# Patient Record
Sex: Male | Born: 1940 | Race: White | Hispanic: No | Marital: Married | Smoking: Former smoker
Health system: Southern US, Community
[De-identification: ages and names within clinical notes are randomized; demographics above are authoritative.]

## PROBLEM LIST (undated history)

## (undated) DIAGNOSIS — G4733 Obstructive sleep apnea (adult) (pediatric): Secondary | ICD-10-CM

## (undated) DIAGNOSIS — J449 Chronic obstructive pulmonary disease, unspecified: Secondary | ICD-10-CM

## (undated) DIAGNOSIS — K219 Gastro-esophageal reflux disease without esophagitis: Secondary | ICD-10-CM

## (undated) DIAGNOSIS — E782 Mixed hyperlipidemia: Secondary | ICD-10-CM

## (undated) DIAGNOSIS — H3530 Unspecified macular degeneration: Secondary | ICD-10-CM

## (undated) DIAGNOSIS — R911 Solitary pulmonary nodule: Secondary | ICD-10-CM

## (undated) HISTORY — DX: Gastro-esophageal reflux disease without esophagitis: K21.9

## (undated) HISTORY — DX: Mixed hyperlipidemia: E78.2

## (undated) HISTORY — DX: Obstructive sleep apnea (adult) (pediatric): G47.33

## (undated) HISTORY — PX: CATARACT EXTRACTION: SUR2

## (undated) HISTORY — PX: RETINAL DETACHMENT REPAIR W/ SCLERAL BUCKLE LE: SHX2338

## (undated) HISTORY — DX: Chronic obstructive pulmonary disease, unspecified: J44.9

## (undated) HISTORY — PX: OTHER SURGICAL HISTORY: SHX169

## (undated) HISTORY — DX: Solitary pulmonary nodule: R91.1

## (undated) HISTORY — DX: Unspecified macular degeneration: H35.30

---

## 1998-11-15 ENCOUNTER — Ambulatory Visit (HOSPITAL_COMMUNITY): Admission: RE | Admit: 1998-11-15 | Discharge: 1998-11-15 | Payer: Self-pay | Admitting: Internal Medicine

## 1998-12-20 ENCOUNTER — Encounter: Payer: Self-pay | Admitting: Thoracic Surgery

## 1998-12-21 ENCOUNTER — Inpatient Hospital Stay (HOSPITAL_COMMUNITY): Admission: RE | Admit: 1998-12-21 | Discharge: 1998-12-25 | Payer: Self-pay | Admitting: Thoracic Surgery

## 1998-12-22 ENCOUNTER — Encounter: Payer: Self-pay | Admitting: Thoracic Surgery

## 1998-12-23 ENCOUNTER — Encounter: Payer: Self-pay | Admitting: Thoracic Surgery

## 1998-12-24 ENCOUNTER — Encounter: Payer: Self-pay | Admitting: Thoracic Surgery

## 1999-05-30 ENCOUNTER — Encounter: Admission: RE | Admit: 1999-05-30 | Discharge: 1999-05-30 | Payer: Self-pay | Admitting: Thoracic Surgery

## 1999-05-30 ENCOUNTER — Encounter: Payer: Self-pay | Admitting: Thoracic Surgery

## 2000-01-01 ENCOUNTER — Encounter: Admission: RE | Admit: 2000-01-01 | Discharge: 2000-01-01 | Payer: Self-pay | Admitting: Thoracic Surgery

## 2000-01-01 ENCOUNTER — Encounter: Payer: Self-pay | Admitting: Thoracic Surgery

## 2000-03-21 ENCOUNTER — Inpatient Hospital Stay (HOSPITAL_COMMUNITY): Admission: EM | Admit: 2000-03-21 | Discharge: 2000-03-22 | Payer: Self-pay | Admitting: *Deleted

## 2000-04-30 ENCOUNTER — Encounter: Admission: RE | Admit: 2000-04-30 | Discharge: 2000-04-30 | Payer: Self-pay | Admitting: Thoracic Surgery

## 2000-04-30 ENCOUNTER — Encounter: Payer: Self-pay | Admitting: Thoracic Surgery

## 2000-10-29 ENCOUNTER — Encounter: Admission: RE | Admit: 2000-10-29 | Discharge: 2000-10-29 | Payer: Self-pay | Admitting: Thoracic Surgery

## 2000-10-29 ENCOUNTER — Encounter: Payer: Self-pay | Admitting: Thoracic Surgery

## 2000-11-18 ENCOUNTER — Other Ambulatory Visit: Admission: RE | Admit: 2000-11-18 | Discharge: 2000-11-18 | Payer: Self-pay | Admitting: Internal Medicine

## 2000-12-08 ENCOUNTER — Ambulatory Visit (HOSPITAL_COMMUNITY): Admission: RE | Admit: 2000-12-08 | Discharge: 2000-12-08 | Payer: Self-pay | Admitting: Internal Medicine

## 2002-01-04 ENCOUNTER — Ambulatory Visit (HOSPITAL_COMMUNITY): Admission: RE | Admit: 2002-01-04 | Discharge: 2002-01-04 | Payer: Self-pay | Admitting: Internal Medicine

## 2002-06-14 ENCOUNTER — Ambulatory Visit (HOSPITAL_COMMUNITY): Admission: RE | Admit: 2002-06-14 | Discharge: 2002-06-14 | Payer: Self-pay | Admitting: Family Medicine

## 2002-06-14 ENCOUNTER — Encounter: Payer: Self-pay | Admitting: Family Medicine

## 2002-08-09 ENCOUNTER — Ambulatory Visit: Admission: RE | Admit: 2002-08-09 | Discharge: 2002-08-09 | Payer: Self-pay | Admitting: Internal Medicine

## 2003-03-23 ENCOUNTER — Encounter (INDEPENDENT_AMBULATORY_CARE_PROVIDER_SITE_OTHER): Payer: Self-pay | Admitting: Internal Medicine

## 2003-03-23 ENCOUNTER — Ambulatory Visit (HOSPITAL_COMMUNITY): Admission: RE | Admit: 2003-03-23 | Discharge: 2003-03-23 | Payer: Self-pay | Admitting: Internal Medicine

## 2003-03-29 ENCOUNTER — Ambulatory Visit (HOSPITAL_COMMUNITY): Admission: RE | Admit: 2003-03-29 | Discharge: 2003-03-29 | Payer: Self-pay | Admitting: Internal Medicine

## 2003-03-29 ENCOUNTER — Encounter (INDEPENDENT_AMBULATORY_CARE_PROVIDER_SITE_OTHER): Payer: Self-pay | Admitting: Internal Medicine

## 2003-04-07 ENCOUNTER — Ambulatory Visit (HOSPITAL_COMMUNITY): Admission: RE | Admit: 2003-04-07 | Discharge: 2003-04-07 | Payer: Self-pay | Admitting: Internal Medicine

## 2003-05-04 ENCOUNTER — Encounter: Payer: Self-pay | Admitting: Internal Medicine

## 2003-05-04 ENCOUNTER — Encounter: Admission: RE | Admit: 2003-05-04 | Discharge: 2003-05-04 | Payer: Self-pay | Admitting: Internal Medicine

## 2003-05-18 ENCOUNTER — Ambulatory Visit (HOSPITAL_COMMUNITY): Admission: RE | Admit: 2003-05-18 | Discharge: 2003-05-18 | Payer: Self-pay | Admitting: Family Medicine

## 2003-05-18 ENCOUNTER — Encounter: Payer: Self-pay | Admitting: Family Medicine

## 2003-11-25 ENCOUNTER — Ambulatory Visit (HOSPITAL_COMMUNITY): Admission: RE | Admit: 2003-11-25 | Discharge: 2003-11-25 | Payer: Self-pay | Admitting: Internal Medicine

## 2004-01-22 ENCOUNTER — Inpatient Hospital Stay (HOSPITAL_COMMUNITY): Admission: EM | Admit: 2004-01-22 | Discharge: 2004-01-23 | Payer: Self-pay | Admitting: Emergency Medicine

## 2004-01-22 ENCOUNTER — Encounter (INDEPENDENT_AMBULATORY_CARE_PROVIDER_SITE_OTHER): Payer: Self-pay | Admitting: *Deleted

## 2004-01-22 ENCOUNTER — Encounter: Payer: Self-pay | Admitting: Emergency Medicine

## 2004-12-19 ENCOUNTER — Ambulatory Visit (HOSPITAL_COMMUNITY): Admission: RE | Admit: 2004-12-19 | Discharge: 2004-12-19 | Payer: Self-pay | Admitting: Internal Medicine

## 2004-12-28 ENCOUNTER — Ambulatory Visit: Payer: Self-pay | Admitting: Internal Medicine

## 2005-01-21 ENCOUNTER — Ambulatory Visit: Payer: Self-pay | Admitting: Internal Medicine

## 2005-01-22 ENCOUNTER — Ambulatory Visit: Payer: Self-pay | Admitting: Internal Medicine

## 2005-02-07 ENCOUNTER — Ambulatory Visit (HOSPITAL_COMMUNITY): Admission: RE | Admit: 2005-02-07 | Discharge: 2005-02-07 | Payer: Self-pay | Admitting: Internal Medicine

## 2005-02-08 ENCOUNTER — Ambulatory Visit: Payer: Self-pay | Admitting: Internal Medicine

## 2005-02-09 ENCOUNTER — Inpatient Hospital Stay (HOSPITAL_COMMUNITY): Admission: EM | Admit: 2005-02-09 | Discharge: 2005-02-13 | Payer: Self-pay | Admitting: Emergency Medicine

## 2005-02-17 ENCOUNTER — Emergency Department (HOSPITAL_COMMUNITY): Admission: EM | Admit: 2005-02-17 | Discharge: 2005-02-17 | Payer: Self-pay | Admitting: Emergency Medicine

## 2005-02-21 ENCOUNTER — Ambulatory Visit: Payer: Self-pay | Admitting: Internal Medicine

## 2005-03-20 ENCOUNTER — Ambulatory Visit: Payer: Self-pay | Admitting: Internal Medicine

## 2005-04-30 ENCOUNTER — Ambulatory Visit: Payer: Self-pay | Admitting: Internal Medicine

## 2005-06-19 ENCOUNTER — Ambulatory Visit: Payer: Self-pay | Admitting: Internal Medicine

## 2005-07-18 ENCOUNTER — Ambulatory Visit: Payer: Self-pay | Admitting: Internal Medicine

## 2005-08-05 HISTORY — PX: LYMPH NODE DISSECTION: SHX5087

## 2005-08-13 ENCOUNTER — Ambulatory Visit (HOSPITAL_COMMUNITY): Admission: RE | Admit: 2005-08-13 | Discharge: 2005-08-13 | Payer: Self-pay | Admitting: Urology

## 2005-11-13 ENCOUNTER — Ambulatory Visit: Payer: Self-pay | Admitting: Pain Medicine

## 2005-11-21 ENCOUNTER — Ambulatory Visit: Payer: Self-pay | Admitting: Pain Medicine

## 2005-11-26 ENCOUNTER — Ambulatory Visit: Payer: Self-pay | Admitting: Pain Medicine

## 2005-12-09 ENCOUNTER — Ambulatory Visit: Payer: Self-pay | Admitting: Pain Medicine

## 2005-12-12 ENCOUNTER — Ambulatory Visit: Payer: Self-pay | Admitting: Pain Medicine

## 2005-12-23 ENCOUNTER — Ambulatory Visit: Payer: Self-pay | Admitting: Internal Medicine

## 2005-12-26 ENCOUNTER — Ambulatory Visit: Payer: Self-pay | Admitting: Pain Medicine

## 2006-01-08 ENCOUNTER — Ambulatory Visit: Payer: Self-pay | Admitting: Physician Assistant

## 2006-04-08 ENCOUNTER — Encounter (HOSPITAL_COMMUNITY): Admission: RE | Admit: 2006-04-08 | Discharge: 2006-05-02 | Payer: Self-pay | Admitting: Unknown Physician Specialty

## 2006-04-25 ENCOUNTER — Emergency Department (HOSPITAL_COMMUNITY): Admission: EM | Admit: 2006-04-25 | Discharge: 2006-04-25 | Payer: Self-pay | Admitting: Emergency Medicine

## 2006-05-06 ENCOUNTER — Ambulatory Visit: Payer: Self-pay | Admitting: Internal Medicine

## 2006-07-04 ENCOUNTER — Ambulatory Visit: Payer: Self-pay | Admitting: Internal Medicine

## 2006-07-18 ENCOUNTER — Ambulatory Visit: Payer: Self-pay | Admitting: Internal Medicine

## 2006-07-26 ENCOUNTER — Encounter: Admission: RE | Admit: 2006-07-26 | Discharge: 2006-07-26 | Payer: Self-pay | Admitting: Neurosurgery

## 2007-02-11 ENCOUNTER — Observation Stay (HOSPITAL_COMMUNITY): Admission: RE | Admit: 2007-02-11 | Discharge: 2007-02-11 | Payer: Self-pay | Admitting: Ophthalmology

## 2007-05-15 DIAGNOSIS — E7800 Pure hypercholesterolemia, unspecified: Secondary | ICD-10-CM

## 2007-05-15 DIAGNOSIS — R911 Solitary pulmonary nodule: Secondary | ICD-10-CM

## 2007-05-15 DIAGNOSIS — Z87891 Personal history of nicotine dependence: Secondary | ICD-10-CM

## 2007-05-15 DIAGNOSIS — J449 Chronic obstructive pulmonary disease, unspecified: Secondary | ICD-10-CM

## 2007-07-17 ENCOUNTER — Ambulatory Visit: Payer: Self-pay | Admitting: Internal Medicine

## 2007-07-24 ENCOUNTER — Encounter: Payer: Self-pay | Admitting: Internal Medicine

## 2007-08-01 ENCOUNTER — Telehealth: Payer: Self-pay | Admitting: Internal Medicine

## 2008-05-30 ENCOUNTER — Ambulatory Visit: Payer: Self-pay | Admitting: Internal Medicine

## 2008-10-18 ENCOUNTER — Encounter: Payer: Self-pay | Admitting: Internal Medicine

## 2008-11-03 ENCOUNTER — Ambulatory Visit: Payer: Self-pay | Admitting: Internal Medicine

## 2009-11-03 ENCOUNTER — Ambulatory Visit: Payer: Self-pay | Admitting: Internal Medicine

## 2009-11-13 ENCOUNTER — Ambulatory Visit: Admission: RE | Admit: 2009-11-13 | Discharge: 2009-11-13 | Payer: Self-pay | Admitting: Internal Medicine

## 2009-11-13 ENCOUNTER — Encounter: Payer: Self-pay | Admitting: Internal Medicine

## 2009-11-18 ENCOUNTER — Ambulatory Visit: Payer: Self-pay | Admitting: Internal Medicine

## 2009-12-01 ENCOUNTER — Telehealth: Payer: Self-pay | Admitting: Internal Medicine

## 2009-12-02 DIAGNOSIS — G4733 Obstructive sleep apnea (adult) (pediatric): Secondary | ICD-10-CM | POA: Insufficient documentation

## 2009-12-04 ENCOUNTER — Telehealth (INDEPENDENT_AMBULATORY_CARE_PROVIDER_SITE_OTHER): Payer: Self-pay | Admitting: *Deleted

## 2009-12-20 ENCOUNTER — Telehealth: Payer: Self-pay | Admitting: Internal Medicine

## 2009-12-26 ENCOUNTER — Encounter: Payer: Self-pay | Admitting: Internal Medicine

## 2009-12-27 ENCOUNTER — Telehealth: Payer: Self-pay | Admitting: Internal Medicine

## 2010-01-03 ENCOUNTER — Encounter: Payer: Self-pay | Admitting: Internal Medicine

## 2010-01-06 ENCOUNTER — Telehealth: Payer: Self-pay | Admitting: Internal Medicine

## 2010-01-18 ENCOUNTER — Encounter: Payer: Self-pay | Admitting: Internal Medicine

## 2010-02-23 ENCOUNTER — Ambulatory Visit: Payer: Self-pay | Admitting: Internal Medicine

## 2010-03-24 ENCOUNTER — Encounter: Payer: Self-pay | Admitting: Internal Medicine

## 2010-07-05 ENCOUNTER — Encounter: Payer: Self-pay | Admitting: Internal Medicine

## 2010-09-04 NOTE — Progress Notes (Signed)
Summary: c pap  Phone Note Call from Patient Call back at [phone]   Caller: [facility] Call For: young Summary of Call: need c pap evaluation Initial call taken by: Rickard Patience,  Dec 04, 2009 10:34 AM  Follow-up for Phone Call        [facility] is requesting ov notes sent in regards to order faxed on 11/03/09 as to why the pt was set up for sleep study. Fax # [phone] Carron Curie CMA  Dec 04, 2009 10:43 AM   Additional Follow-up for Phone Call Additional follow up Details #1::        faxedt ov notes to [facility] Additional Follow-up by: Oneita Jolly,  Dec 04, 2009 10:45 AM

## 2010-09-04 NOTE — Progress Notes (Signed)
Summary: Start CPAP  Phone Note Call from Patient Call back at Home Phone [phone]   Caller: Patient Call For: Monteen Toops Summary of Call: pt states he has not heard back re: cxr from 4/1. also wants to know about sleep study results from 4/11.  Initial call taken by: Tivis Ringer, CNA,  December 01, 2009 10:37 AM  Follow-up for Phone Call        Pt requesting results of CXR from 11/03/09 and sleep study from 11/13/09. Please advise.Carron Curie CMA  December 01, 2009 10:39 AM   Additional Follow-up for Phone Call Additional follow up Details #1::        I called Mr Squier and reviewd CXR and NPSG. I explained dx sleep apnea . He is ready to try CPAP and asks that it be set up through [employer]. He also understands he will need office face to face f/u within the Medicare window. Additional Follow-up by: Waymon Budge MD,  December 01, 2009 5:33 PM  New Problems: OBSTRUCTIVE SLEEP APNEA (ICD-327.23)   New Problems: OBSTRUCTIVE SLEEP APNEA (ICD-327.23) New/Updated Medications: * CPAP 7 [facility]

## 2010-09-04 NOTE — Progress Notes (Signed)
Summary: order  Phone Note Call from Patient   Caller: Patient Call For: Captain Blucher Summary of Call: pt need order to get auto cpap titration . send to [facility] Initial call taken by: Rickard Patience,  Dec 20, 2009 11:39 AM  Follow-up for Phone Call        pt states he feels liks he cannot breathe on the settigns he is on, he staets he has to sit up to feel like he is getting any air. He spoke to [employer] and they are rec. an order for auto titration to see pt optimal settings. Pelase advise if ok to place order. Thanks.Carron Curie CMA  Dec 20, 2009 11:50 AM   Additional Follow-up for Phone Call Additional follow up Details #1::        OK- ordered. If [place] keeps up, it may be a different health problem rather than his sleep apnea.  Order faxed to [facility]. Rhonda Cobb  Dec 20, 2009 3:50 PM Spoke with pt and advised pt that once we do the download that if he is still having problems breathing then he will need to make an appt with Dr. Maple Hudson b/c it could be another health issue that is causing his breathing issues. Pt understood and stated that he would let us know if his breathing issue doesn't clear. Rhonda Cobb  Dec 20, 2009 3:55 PM

## 2010-09-04 NOTE — Letter (Signed)
Summary: CMN/[facility]  CMN/[facility]   Imported By: Lester [place] 03/28/2010 08:00:32  _____________________________________________________________________  External Attachment:    Type:   Image     Comment:   External Document

## 2010-09-04 NOTE — Progress Notes (Signed)
Summary: Asks change CPAP back to AutoPAP 20/8  Phone Note Call from Patient   Summary of Call: While here 01/04/10 with mother's visit, he asked if we could change CPAP back to permanent autotitration. He is pulling mask off in sleep repeatedly since fixed pressure used.  Follow-up for Phone Call        Order faxed to [facility] to change cpap back to auto at 20/8. Rhonda Cobb  January 08, 2010 8:40 AM     New/Updated Medications: * CPAP AUTO 20-8  [facility] Intolerant fixed pressure

## 2010-09-04 NOTE — Assessment & Plan Note (Signed)
Summary: 1 year/ mbw   Primary Provider/Referring Provider:  Caprice Renshaw  CC:  Yearly follow up visit-? about sleep apnea..  History of Present Illness:  05/30/08- Here with wife. CXR 12/08 questioned new nodule, revealed on CT at [facility] to be stable old fibrotic scarring with no new process. Since quit smoking breathing has been comfortable. Now pending cataract sgy. Denies change, new problems with breathing. Little cough, no chest pain, palpitation, bleeding or fever.  PFT 04/30/05- minimal obstrudtive disease. FEV1/FVC 0.74. DLCO 75%.  has been told he has cerebrovascular disease- left carotid. Declines Flu vax.  11/03/08- Hx Lung nodule had acute bronchits, probably from viral infection late Feb. Needed 2 rounds prednisone, antibiotics. He is almost at 1 yr anniversary of smoking cessation. Has gained weight, making him more sob. Walks 1/2 hr daily. Denies routine cough or chest pain. Had borderline sleep apnea. with RDI 5/hr in jan '04. Wife punches hiim and we discussed this relative to his weight gain.  Brings CXR done 10/18/08- Chronic changes and old surgery, but NAD. Images reviewed.   November 03, 2009- Hx lung nodule, former tobacco, ? OSA He doesn't notice change. He asks if he should have a new sleep study. Wife and Minister on a trip with him both say he snores and stops breathing. Denies significant daytime sleepiness. Some weight gain. He tries to walk and exercise daily to take care of chronic back pain. NPSG 2004, AHI 5 when he weighed 171lbs. Now 198 lbs.    Current Medications (verified): 1)  Nexium 40 Mg  Cpdr (Esomeprazole Magnesium) .... Take 1 Tablet By Mouth Two Times A Day 2)  Paxil 20 Mg  Tabs (Paroxetine Hcl) .... Take 1 Tablet By Mouth Once A Day 3)  Vytorin 10-80 Mg Tabs (Ezetimibe-Simvastatin) .... Take 1 By Mouth Once Daily 4)  Amitriptyline Hcl 75 Mg  Tabs (Amitriptyline Hcl) .... Take 1 Tab By Mouth At Bedtime 5)  Temazepam 30 Mg  Caps (Temazepam) .... Take  1 Tab By Mouth At Bedtime 6)  Bayer Low Strength 81 Mg  Tbec (Aspirin) .... Take 1 Tablet By Mouth Once A Day 7)  Multivitamins   Tabs (Multiple Vitamin) .... Take 1 Tablet By Mouth Once A Day 8)  Gabapentin 300 Mg Caps (Gabapentin) .... Take 1 By Mouth Two Times A Day 9)  Meloxicam 7.5 Mg Tabs (Meloxicam) .... Take 1 By Mouth Two Times A Day  Allergies (verified): 1)  ! Codeine 2)  ! Morphine 3)  ! Erythromycin 4)  ! * Dilaudid  Past History:  Past Surgical History: Last updated: 05/30/2008 cataract VATS resection intraparenchymal lymph node  Family History: Last updated: 11/03/2009 Mother- vocal cord paralysis, sleeps with BIPAP Father- died pneumonia, CHF  Social History: Last updated: 11/03/2009 Patient states former smoker. Quit 2009 married  Risk Factors: Smoking Status: quit (05/30/2008)  Past Medical History: R VATS for intrapulmonary lymph node 2007 NPSG wnl- 08/09/02, AHI 5; Repair deviated nasal septum Left carotid disease COPD- mild- FEV1/FVC 0.74- 2006  Family History: Mother- vocal cord paralysis, sleeps with BIPAP Father- died pneumonia, CHF  Social History: Patient states former smoker. Quit 2009 married  Review of Systems      See HPI  The patient denies anorexia, fever, weight loss, weight gain, vision loss, decreased hearing, hoarseness, chest pain, syncope, dyspnea on exertion, peripheral edema, prolonged cough, headaches, hemoptysis, and severe indigestion/heartburn.    Vital Signs:  Patient profile:   70 year old male Height:  70 inches Weight:      198.25 pounds BMI:     28.55 O2 Sat:      95 % on Room air Pulse rate:   86 / minute BP sitting:   120 / 78  (left arm) Cuff size:   regular  Vitals Entered By: Reynaldo Minium CMA (November 03, 2009 1:38 PM)  O2 Flow:  Room air  Physical Exam  Additional Exam:  General: A/Ox3; pleasant and cooperative, NAD, weight gain SKIN: no rash, lesions NODES: no lymphadenopathy HEENT: [place]/AT,  EOM- WNL, Conjuctivae- clear, PERRLA, TM-hearing aides, Nose- clear, Throat- clear and wnl, dentures, Mallampati  IV NECK: Supple w/ fair ROM, JVD- none, normal carotid impulses w/o bruits Thyroid- normal to palpation CHEST: Clear to P&A, question trace expiratory wheeze in bases. HEART: RRR, no m/g/r heard ABDOMEN: Soft  KYH:CWCB, nl pulses, no edema  NEURO: Grossly intact to observation      Impression & Recommendations:  Problem # 1:  COPD, MILD (ICD-496) Controlled without clinical change.  Problem # 2:  SNORING (ICD-786.09)  He has gained over 20 lbs since last sleep study and may well have more significant sleep apnea. We can arrange a sleep study.  Medications Added to Medication List This Visit: 1)  Vytorin 10-80 Mg Tabs (Ezetimibe-simvastatin) .... Take 1 by mouth once daily 2)  Gabapentin 300 Mg Caps (Gabapentin) .... Take 1 by mouth two times a day 3)  Meloxicam 7.5 Mg Tabs (Meloxicam) .... Take 1 by mouth two times a day  Other Orders: Est. Patient Level III (76283) Sleep Disorder Referral (Sleep Disorder) Radiology Referral (Radiology)  Patient Instructions: 1)  Please schedule a follow-up appointment in 1 year. 2)  See [facility] to schedule CXR and Sleep Study at [facility]

## 2010-09-04 NOTE — Letter (Signed)
Summary: CMN/[facility]  CMN/[facility]   Imported By: Lester [place] 01/23/2010 08:58:52  _____________________________________________________________________  External Attachment:    Type:   Image     Comment:   External Document

## 2010-09-04 NOTE — Letter (Signed)
Summary: CMN/[facility]  CMN/[facility]   Imported By: Lester [place] 07/13/2010 09:45:02  _____________________________________________________________________  External Attachment:    Type:   Image     Comment:   External Document

## 2010-09-04 NOTE — Progress Notes (Signed)
Summary: Change cpap to 14 based on download  Phone Note Other Incoming   Summary of Call: [facility] sends CPAP download from 12/26/09- 14 cwp. will change cpap order to that. Initial call taken by: Waymon Budge MD,  Dec 27, 2009 5:47 PM    New/Updated Medications: * CPAP 14  [facility]

## 2010-09-04 NOTE — Assessment & Plan Note (Signed)
Summary: cpap re-eval/klw   Primary Provider/Referring Provider:  Caprice Renshaw  CC:  cpap re evaluation, pt states he uses cpapc eveyrnight for 7-8 hrs a night and uses it when take naps, pt states occasionally the mask will slip up over his face during the night, and pt has no current problems going on.  History of Present Illness: 11/03/08- Hx Lung nodule had acute bronchits, probably from viral infection late Feb. Needed 2 rounds prednisone, antibiotics. He is almost at 1 yr anniversary of smoking cessation. Has gained weight, making him more sob. Walks 1/2 hr daily. Denies routine cough or chest pain. Had borderline sleep apnea. with RDI 5/hr in jan '04. Wife punches hiim and we discussed this relative to his weight gain.  Brings CXR done 10/18/08- Chronic changes and old surgery, but NAD. Images reviewed.   November 03, 2009- Hx lung nodule, former tobacco, ? OSA He doesn't notice change. He asks if he should have a new sleep study. Wife and Minister on a trip with him both say he snores and stops breathing. Denies significant daytime sleepiness. Some weight gain. He tries to walk and exercise daily to take care of chronic back pain. NPSG 2004, AHI 5 when he weighed 171lbs. Now 198 lbs.  February 23, 2010- OSA, hx lung nodule.................Marland Kitchenwife here He had asked change from CPAP 14, based on  download, back to AutoPAP range 8-20 cwp. Was not comfortable on fixed CPAP. He says AutoPAP is much better and wife says he doesn/'t snore and isn't as restless. Wears it at least 5-6 hours/ night.  Weight up since he's been sitting with mother during her illness and unable to walk regularly.    Preventive Screening-Counseling & Management  Alcohol-Tobacco     Smoking Status: quit     Packs/Day: >1ppd     Year Started: age 37     Year Quit: 2009  Current Medications (verified): 1)  Nexium 40 Mg  Cpdr (Esomeprazole Magnesium) .... Take 1 Tablet By Mouth Two Times A Day 2)  Paxil 20 Mg  Tabs  (Paroxetine Hcl) .... Take 1 Tablet By Mouth Once A Day 3)  Vytorin 10-80 Mg Tabs (Ezetimibe-Simvastatin) .... Take 1 By Mouth Once Daily 4)  Amitriptyline Hcl 75 Mg  Tabs (Amitriptyline Hcl) .... Take 1 Tab By Mouth At Bedtime 5)  Temazepam 30 Mg  Caps (Temazepam) .... Take 1 Tab By Mouth At Bedtime 6)  Bayer Low Strength 81 Mg  Tbec (Aspirin) .... Take 1 Tablet By Mouth Once A Day 7)  Multivitamins   Tabs (Multiple Vitamin) .... Take 1 Tablet By Mouth Once A Day 8)  Gabapentin 300 Mg Caps (Gabapentin) .... Take 1 By Mouth Two Times A Day 9)  Meloxicam 7.5 Mg Tabs (Meloxicam) .... Take 1 By Mouth Two Times A Day 10)  Cpap Auto 20-8  [facility] .... Intolerant Fixed Pressure  Allergies (verified): 1)  ! Codeine 2)  ! Morphine 3)  ! Erythromycin 4)  ! * Dilaudid  Past History:  Past Medical History: Last updated: 11/03/2009 R VATS for intrapulmonary lymph node 2007 NPSG wnl- 08/09/02, AHI 5; Repair deviated nasal septum Left carotid disease COPD- mild- FEV1/FVC 0.74- 2006  Past Surgical History: Last updated: 05/30/2008 cataract VATS resection intraparenchymal lymph node  Family History: Last updated: 11/03/2009 Mother- vocal cord paralysis, sleeps with BIPAP Father- died pneumonia, CHF  Social History: Last updated: 11/03/2009 Patient states former smoker. Quit 2009 married  Risk Factors: Smoking Status: quit (02/23/2010) Packs/Day: >  1ppd (02/23/2010)  Social History: Packs/Day:  >1ppd  Review of Systems      See HPI  The patient denies shortness of breath with activity, shortness of breath at rest, productive cough, non-productive cough, coughing up blood, chest pain, irregular heartbeats, acid heartburn, indigestion, loss of appetite, weight change, abdominal pain, difficulty swallowing, sore throat, tooth/dental problems, headaches, nasal congestion/difficulty breathing through nose, and sneezing.    Vital Signs:  Patient profile:   70 year old  male Height:      70 inches Weight:      207.8 pounds O2 Sat:      94 % on Room air Pulse rate:   85 / minute BP sitting:   132 / 82  (right arm) Cuff size:   regular  Vitals Entered By: Reynaldo Minium CMA (February 23, 2010 1:43 PM)  O2 Flow:  Room air CC: cpap re evaluation, pt states he uses cpapc eveyrnight for 7-8 hrs a night and uses it when take naps, pt states occasionally the mask will slip up over his face during the night, pt has no current problems going on Comments meds and allergies updated .phone Reynaldo Minium CMA  February 23, 2010 1:44 PM    Physical Exam  Additional Exam:  General: A/Ox3; pleasant and cooperative, NAD, weight gain SKIN: no rash, lesions NODES: no lymphadenopathy HEENT: [place]/AT, EOM- WNL, Conjuctivae- clear, PERRLA, TM-hearing aides, Nose- clear, Throat- clear and wnl, dentures, Mallampati  IV. He pulls right nasolabial fold laterally to keep his nose open. NECK: Supple w/ fair ROM, JVD- none, normal carotid impulses w/o bruits Thyroid- normal to palpation CHEST: Clear to P&A, Not wheezes. HEART: RRR, no m/g/r heard ABDOMEN: Soft  SAY:TKZS, nl pulses, no edema  NEURO: Grossly intact to observation      Impression & Recommendations:  Problem # 1:  OBSTRUCTIVE SLEEP APNEA (ICD-327.23)  Good control and adequate compliance now on CPAP autotitating in range 8-12. he is sleeping better and wife confirms he doesn't snore.  Problem # 2:  COPD, MILD (ICD-496) He is not needing routine breathing meds. He is encouraged not to let weight gain get out of hand. We will keep an eye on this.  Problem # 3:  Hx of LUNG NODULE (ICD-518.89)  Not an active issue/ We can check an incidental CXR in future.  Other Orders: Est. Patient Level III (01093)  Patient Instructions: 1)  Please schedule a follow-up appointment in 1 year. 2)  Continue CPAP with autotitration in range 8-20

## 2010-09-20 ENCOUNTER — Other Ambulatory Visit (INDEPENDENT_AMBULATORY_CARE_PROVIDER_SITE_OTHER): Payer: Self-pay | Admitting: Internal Medicine

## 2010-09-20 ENCOUNTER — Encounter (HOSPITAL_BASED_OUTPATIENT_CLINIC_OR_DEPARTMENT_OTHER): Payer: Medicare Other | Admitting: Internal Medicine

## 2010-09-20 ENCOUNTER — Ambulatory Visit (HOSPITAL_COMMUNITY)
Admission: RE | Admit: 2010-09-20 | Discharge: 2010-09-20 | Disposition: A | Discharge status: Home / Self Care | Payer: Medicare Other | Source: Ambulatory Visit | Attending: Internal Medicine | Admitting: Internal Medicine

## 2010-09-20 DIAGNOSIS — Z1211 Encounter for screening for malignant neoplasm of colon: Secondary | ICD-10-CM

## 2010-09-20 DIAGNOSIS — K644 Residual hemorrhoidal skin tags: Secondary | ICD-10-CM

## 2010-09-20 DIAGNOSIS — D126 Benign neoplasm of colon, unspecified: Secondary | ICD-10-CM

## 2010-09-20 DIAGNOSIS — Z860100 Personal history of colon polyps, unspecified: Secondary | ICD-10-CM | POA: Insufficient documentation

## 2010-09-20 DIAGNOSIS — Z09 Encounter for follow-up examination after completed treatment for conditions other than malignant neoplasm: Secondary | ICD-10-CM | POA: Insufficient documentation

## 2010-10-14 NOTE — Op Note (Signed)
  NAMEELIUD, Tyler Perkins                 ACCOUNT NO.:  [id number]  MEDICAL RECORD NO.:  [id number]           PATIENT TYPE:  O  LOCATION:  DAYP                          FACILITY:  [facility]  PHYSICIAN:  Lionel December, M.D.    DATE OF BIRTH:  [birth year]  DATE OF PROCEDURE:  09/20/2010 DATE OF DISCHARGE:                              OPERATIVE REPORT   PROCEDURE:  Colonoscopy.  INDICATION:  Tyler Perkins is 70 year old Caucasian male with history of colonic adenomas.  His last exam was over 5 years ago.  Procedures were reviewed with the patient.  Informed consent was obtained.  MEDS FOR CONSCIOUS SEDATION: 1. Demerol 100 mg IV. 2. Versed 10 mg IV.  FINDINGS:  Procedure performed in endoscopy suite.  The patient's vital signs and O2 sat were monitored during the procedure and remained stable.  The patient was very hard to sedate and was very tense, finally seemed to settle down with medication.  The patient was placed in left lateral recumbent position and rectal examination performed.  No abnormality noted on external or digital exam.  Pentax videoscope was placed in rectum and advanced under vision into sigmoid colon and beyond.  Preparation was excellent.  Somewhat redundant colon, but scope was passed into cecum which was identified by appendiceal orifice and lipomatous ileocecal valve.  Preparation was excellent.  As the scope was withdrawn, colonic mucosa was carefully examined.  The only finding was a 3-mm polyp at descending colon which was ablated via cold biopsy. Mucosa and rest of the colon was normal.  Rectal mucosa similarly was normal.  Scope was retroflexed to examine anorectal junction and small hemorrhoids noted below the dentate line.  Endoscope was then withdrawn. The patient tolerated the procedure well.  Withdrawal time was 7 minutes.  FINAL DIAGNOSIS: 1. Examination performed to cecum. 2. A 3-mm polyp ablated via cold biopsy from descending colon. 3. Small external  hemorrhoids.  RECOMMENDATIONS:  Standard instructions given.  I will be contacting patient with results of biopsy and further recommendations.     Lionel December, M.D.     NR/MEDQ  D:  09/20/2010  T:  09/20/2010  Job:  [id number]  cc:   Dr. Phillips Odor  Electronically Signed by Lionel December M.D. on 10/14/2010 01:08:40 PM

## 2010-12-18 NOTE — Op Note (Signed)
Tyler Perkins, Tyler Perkins                 ACCOUNT NO.:  [id number]   MEDICAL RECORD NO.:  [id number]          PATIENT TYPE:  AMB   LOCATION:  SDS                          FACILITY:  [facility]   PHYSICIAN:  John D. Ashley Royalty, M.D. 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   PROCEDURE:  Scleral buckle right eye, gas injection right eye, retinal  photocoagulation right eye.   SURGEON:  Beulah Gandy. Ashley Royalty, M.D.   ASSISTANT:  Bryan Lemma. Lundquist, P.A.   ANESTHESIA:  General.   DETAILS:  Usual prep and drape.  360 degrees limbal peritomy. Isolation  of the four rectus muscles on 2-0 silk.  Localization of break at 2  o'clock.  Scleral dissection for 360 degrees to admit a number 279  intrascleral implant.  Diathermy placed in the bed.  A 279 implant was  placed around the eye with the joint at 10 o'clock.  A 240 band was  placed around the eye with a 270 sleeve at 2 o'clock.  The radial 508 G7  was fashioned to fit beneath the break at 2 o'clock.  Perforation site  chosen at 1 o'clock in the posterior aspect of the bed.  A large amount  of clear colorless subretinal fluid came forth.  Perfluoropropane 40%,  1.2 cc, was injected through the 12 o'clock pars plana to reinflate the  globe.  Two sutures per quadrant were placed with 4-0 [place].  The  scleral flaps were closed over the buckle elements.  The buckle was  adjusted.  The band was adjusted and trimmed.  Indirect ophthalmoscopy  showed the retina to be lying nicely in place on the scleral buckle and  the break well supported on the radial segment.  The indirect  ophthalmoscope laser was moved into place; 395 burns were placed around  the periphery with a power of 2000 milliwatts, 1000 microns each, and  0.15 seconds each.  The scleral flaps were closed, sutures knotted, and  the  free ends removed.  The conjunctiva was reposited with 7-0 chromic  suture.  Polymyxin and gentamicin were irrigated into Tenon's space.  No  atropine was used.  Marcaine was injected around the globe for postop  pain.  Decadron 10 mg was injected in this lower subconjunctival space.  The closing pressure was 15 with a Baer keratometer.   COMPLICATIONS:  None.   OPERATIVE TIME:  2 hours.   TobraDex and a patch and shield were placed.  The patient was awakened  and taken to recovery in satisfactory condition.     Beulah Gandy. Ashley Royalty, M.D.  Electronically Signed    JDM/MEDQ  D:  02/11/2007  T:  02/11/2007  Job:  [id number]

## 2010-12-21 NOTE — Assessment & Plan Note (Signed)
Tyler Perkins                             PULMONARY OFFICE NOTE   NAME:Perkins, Tyler POSTLEWAIT                        MRN:          [id number]  DATE:07/18/2006                            DOB:          July 15, 1941    PROBLEM:  1. History of chronic lung nodule.  2. Snoring.  3. Mild chronic obstructive pulmonary disease.   HISTORY:  He remains off of cigarettes, although his wife smokes and he  was making a long-term effort.  After discussion, he would like to keep  some Chantix available for use to fight the temptation to restart  smoking.  The biggest issue for him now is persistent low back pain.  He  is wearing an abdominal bolster most of the time to provide back  support, and says back pain is an overriding issue.  He is trying to  walk 2 miles a day.  He notices dizziness with quick standing.  This  includes some mild vertigo.  He has an appointment with Tyler Perkins.   MEDICATIONS:  1. Nexium.  2. Paxil 20 mg.  3. Aspirin 81 mg.  4. Vytorin 10 mg.  5. Amitriptyline 100 mg at h.s.  6. Temazepam 30 mg h.s. p.r.n.  7. [place] t.i.d. or q.i.d. p.r.n.   ALLERGIES:  DRUG INTOLERANCE OF CODEINE, MORPHINE, AND ERYTHROMYCIN.   OBJECTIVE:  Weight 160 pounds.  BP 166/96.  Pulse regular 109.  Room air  saturation 97%.  He is talkative.  Breathing does not seem labored.  Wearing a  back/abdominal wrap around support.  Heart sounds are regular without murmur or gallop.  He is not coughing, voice quality is normal.  LUNGS:  Fields are clear.  There is a biopsy site with scab on his left arm, and a couple of small,  raised erythematous, rather nodular, spots on his forearms.  He shows me  a number of small spots on his arms.  He says he has been evaluated for  what is being called urticaria, evaluated by Tyler Perkins, and says these  spots come and go.  Biopsy was interpreted as urticaria with no cause  identified.  We discussed urticaria.  He says these come  and go.  What  he shows me looks as if it may be many individual lesions, last longer  than we usually think of as hives.   IMPRESSION:  1. Skin rash, possibly punctate urticaria.  Already seen by      dermatology.  2. Previous pulmonary scarring and nodule, which had been stable after      chest x-ray in October described stable chest with scarring but no      acute process.   PLAN:  1. Stay off of cigarettes. He was given refill prescription for      Chantix, 1 daily or b.i.d. p.r.n.  2. Clobetasol 0.05% topics for urticaria b.i.d. for no more than 2      weeks as discussed.  3. Schedule return 1 year, earlier p.r.n.     Tyler D. Maple Hudson, MD, FCCP, FACP  Electronically Signed  CDY/MedQ  DD: 07/19/2006  DT: 07/19/2006  Job #: [id number]   cc:   Tyler Perkins

## 2010-12-21 NOTE — Discharge Summary (Signed)
Tyler Perkins, Tyler Perkins                 ACCOUNT NO.:  [id number]   MEDICAL RECORD NO.:  [id number]          PATIENT TYPE:  INP   LOCATION:  A332                          FACILITY:  [facility]   PHYSICIAN:  Lionel December, M.D.    DATE OF BIRTH:  01/20/1941   DATE OF ADMISSION:  02/08/2005  DATE OF DISCHARGE:  07/12/2006LH                                 DISCHARGE SUMMARY   ADMITTING DIAGNOSES:  1.  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  2.  Depression, anxiety, insomnia.  3.  Gastroesophageal reflux disease.   DISCHARGE DIAGNOSES:  1.  Post-polypectomy microperforation, spontaneously sealed.  2.  Gastroesophageal reflux disease.  3.  Depression, anxiety, and insomnia.   SERVICE:  Lionel December, M.D.   STUDIES:  CT of the abdomen and pelvis with and without contrast revealed  stable low attenuation lesions within the right kidney most likely a simple  cyst, tiny calcification within the collecting system of the left kidney,  within the midtransverse colon there is a short segment of abnormal wall  thickening with surrounding inflammatory changes.  There was moderate to  marked amount of free intraperitoneal air noted surrounding this area of  inflammation as well as under the diaphragm and under the liver.  Findings  felt to be due to colonic perforation secondary to polypectomy.   HISTORY OF PRESENT ILLNESS:  Patient is a 70 year old Caucasian gentleman  who underwent EGD and colonoscopy on February 07, 2005 to further evaluate chest  pressure/dysphagia and Hemoccult-positive stools.  He has chronic GERD and  has been on multiple PPIs without any improvement.  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  He had a single small polyp at the transverse colon  which was snared.  Most of the polyp was coagulated.  Patient left this  facility without any problems.  He did well for the  remainder part of the  day but the following morning he woke up and ate breakfast.  Around 9:30  a.m. he developed sudden onset of midabdominal pain and became diaphoretic.  He called our office and was notified to go directly to emergency department  for further evaluation.   HOSPITAL COURSE:  On presentation to the emergency department, patient was  evaluated by Dr. Karilyn Cota.  Initially, it was felt that he may have post-  polypectomy syndrome.  His initial labs revealed a white count of 16,200,  hemoglobin 15.1, acute abdominal series revealed a mild ileus-like pattern  and chronic pleuroparenchymal changes in the right lower chest but no  evidence of free air.  Patient was initially admitted for observation,  primarily for pain control.  He was also started on IV Cipro and Flagyl for  precautionary purposes for a possible colonic perforation.  He was given  Dilaudid for pain.  He developed rash and itching felt to be due to the  Dilaudid.  He received Benadryl with good results.  Over the first 24 hours  of hospitalization, his pain persisted.  Dr.  [facility], who was covering  for our practice, arranged for CT of the abdomen and pelvis with findings as  outlined above.  He was continued on bowel rest for over 48 hours.  Gradually, he began improving and he was started on a clear liquid diet and  by day of discharge he was on a full liquid diet with minimal abdominal pain  and passing unremarkable bowel movements.   Other issues during hospitalization was that of nausea and feeling anxious.  On third day of hospitalization his IV Protonix was increased to b.i.d. and  he was given lorazepam with good results of his anxiety.   LABORATORY DATA:  White count day of discharge was down to 8900, hemoglobin  was 13.8.  He had mild hyponatremia during hospitalization initially once  the sodium was 128 but on February 11, 2005 was 133.  Creatinine was normal  throughout the stay at 1.1.  Potassium  was 3.5.   PHYSICAL EXAMINATION DAY OF DISCHARGE:  Abdomen was soft with normoactive  bowel sounds.  There was mild left lower quadrant tenderness but much  improved.  Lungs were clear to auscultation.  Cardiac exam revealed regular  rate and rhythm.  There was no lower extremity edema.   DISCHARGE CONDITION:  Stable.   DISCHARGE MEDICATIONS:  1.  He will resume home medications including Nexium, Paxil, temazepam,      amitriptyline.  There was consideration for him discontinuing      amitriptyline as the patient states he does not feel like it makes any      difference whether he is on it or not.  He has been on it for years,      therefore, we elected to continue for now and we will consider taper at      a later date.  2.  Flagyl 250 mg p.o. q.i.d. for 14 days; prescription given.  3.  Cipro 500 mg p.o. b.i.d. for 14 days; prescription given.  4.  Talacen one every 4 hours p.r.n. pain; prescription for (#20) with no      refills.  5.  Tessalon Perles one t.i.d. p.r.n.; prescription given for (#30) with no      refills.   DISCHARGE INSTRUCTIONS:  1.  He will continue a full liquid diet for 48 hours at which time Dr.      Karilyn Cota will call the patient and give further instructions.  2.  He will eat yogurt twice daily.  3.  He is to do no heavy lifting for 2 weeks.  4.  He is to gradually increase his activity levels.  5.  He will call with any abdominal pain, fever, bloody stools, or vomiting.  6.  Followup appointment with Dr. Karilyn Cota on Thursday, February 21, 2005 at 11:15      a.m.       LL/MEDQ  D:  02/15/2005  T:  02/15/2005  Job:  [id number]   cc:   Madelin Rear. Sherwood Gambler, MD  [address]  [place]  [place] [address]  Fax: [phone]

## 2010-12-21 NOTE — Op Note (Signed)
NAME:  Tyler Perkins, Tyler Perkins                           ACCOUNT NO.:  [id number]   MEDICAL RECORD NO.:  [id number]                   PATIENT TYPE:  AMB   LOCATION:  DAY                                  FACILITY:  [facility]   PHYSICIAN:  Lionel December, M.D.                 DATE OF BIRTH:  03/27/1941   DATE OF PROCEDURE:  04/07/2003  DATE OF DISCHARGE:                                 OPERATIVE REPORT   PROCEDURE:  Flexible sigmoidoscopy.   ENDOSCOPIST:  Lionel December, M.D.   INDICATIONS:  The patient is a 70 year old Caucasian male with an almost 3-  month history of rectal discomfort, burning and pain.  He has been tried on  various topical medications.  He feels miserable and very concerned.  The  pain and discomfort is experienced more when he is sitting or lying in bed  or when he stands up.  I did a pelvic CT and MRI with attention to his  sacrum.  I could not see any soft tissue or neurologic abnormality.  He is  undergoing a sigmoidoscopy to look at his rectal mucosa prior to referral to  a tertiary center for a second opinion or anorectal manometry.  The  procedure and risks were reviewed with the patient and informed consent was  obtained.   PREOPERATIVE MEDICATIONS:  None.   FINDINGS:  Procedure performed in endoscopy suite. The patient was placed in  the left lateral recumbent position and rectal examination was performed.  The rectal tone was somewhat increased.  He complained of burning pain on  digital exam.   The Olympus videoscope was placed in the rectum and advanced under vision  into the sigmoid colon.  The scope was easily passed to 50 cm.  The mucosa  of the sigmoid colon was normal.  Rectal mucosa was similarly normal.  Preparation was excellent.   The scope was retroflexed to examine the anorectal junction.  He had very  small hemorrhoids below the dentate line and focal thickening of the mucosa  of the anal canal, but there were no tears or mucosal friability. The  endoscope was straightened and withdrawn.  The patient tolerated the  procedure well.   FINAL DIAGNOSIS:  Small external hemorrhoids, otherwise normal flexible  sigmoidoscopy.    RECOMMENDATIONS:  Empiric trial with Rowasa suppository 1 per rectum b.i.d.  for 2 weeks.  We will also make an appointment for him to see Dr. Durenda Hurt of [facility].                                               Lionel December, M.D.    NR/MEDQ  D:  04/07/2003  T:  04/08/2003  Job:  [id number]   cc:   Madelin Rear. Sherwood Gambler, M.D.  [address]  [place]  [place] [address]  Fax: [phone]

## 2010-12-21 NOTE — Op Note (Signed)
NAMERHEN, KAWECKI                 ACCOUNT NO.:  [id number]   MEDICAL RECORD NO.:  [id number]          PATIENT TYPE:  AMB   LOCATION:  DAY                           FACILITY:  [facility]   PHYSICIAN:  Lionel December, M.D.    DATE OF BIRTH:  03-28-1941   DATE OF PROCEDURE:  02/07/2005  DATE OF DISCHARGE:                                 OPERATIVE REPORT   PROCEDURE:  Esophagogastroduodenoscopy with esophageal dilation followed by  colonoscopy with polypectomy.   INDICATIONS:  Tyler Perkins is a 70 year old Caucasian male who presents with solid  food dysphagia, pressure in his retrosternal area for the last 6-8 weeks. He  states his heartburn and regurgitation is well-controlled with therapy. We  switched him from Prevacid to Zegerid, and now he is on Nexium but still  cannot tell any improvement. He was also noted to have two out of three heme-  positive stools. He does have history of colonic polyps. He had a tubular  adenoma removed three years ago. Because of heme-positive stools, we decided  to also proceed with this in June 2003.   Both the procedure risks were reviewed with the patient, and informed  consent was obtained.   PREMEDICATION:  Cetacaine spray for pharyngeal topical anesthesia, Demerol  50 mg IV, Versed 10 mg IV in divided dose.   FINDINGS:  Procedure performed in endoscopy suite. The patient's vital signs  and O2 saturation were monitored during the procedure and remained stable.   PROCEDURE #1:  Esophagogastroduodenoscopy. The patient was placed in left  lateral position and Olympus videoscope was passed via oropharynx without  any difficulty into esophagus.   Esophagus. Mucosa of the esophagus was normal throughout. GE junction was at  40 cm from the incisors and was normal.   Stomach. It was empty and distended very well insufflation. Folds of the  proximal stomach were normal. Examination of the mucosa reveals a large  patch of antral mucosa, at least 2 x 3 cm, with  vascular ectasia. There are  some separate satellite lesions. None of these were bleeding and therefore  not treated. Pyloric channel was patent. Angularis, fundus and cardia  examined by retroflexing the scope and were normal.   Endoscope was withdrawn.   Esophagus was dilated by passing 56-French Maloney dilator which resulted a  tiny tear at cervical esophagus which might indicated a small web. Endoscope  was withdrawn and the patient was prepared for procedure #2.   PROCEDURE #2:  Colonoscopy. Rectal examination performed. This was within  normal limits. Olympus videoscope was placed in rectum and advanced under  vision into sigmoid colon and beyond. Preparation was satisfactory. Scope  was advanced into cecum which was identified by appendiceal orifice and  ileocecal valve. Pictures taken for the record. As the scope was withdrawn,  colonic mucosa was carefully examined. There was single small polyp at  transverse colon which was snared. Most of this polyp was coagulated, but a  small portion was taken for histology. Mucosa rest of the colon was normal.  Rectal mucosa similarly was normal. Scope was retroflexed to examine  anorectal junction which was unremarkable. Endoscope was straightened and  withdrawn. The patient tolerated the procedure well.   FINAL DIAGNOSIS:  Normal examination of esophagus. 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 Ectasia  would explain his heme-positive stools. Need to rule out associated H pylori  infection.   Single small polyp snared from transverse colon.   RECOMMENDATIONS:  CBC will be checked today along with H pylori. No aspirin  for one week. He will continue anti-reflux measures and Nexium at 40 mg p.o.  b.i.d.   I will be contacting the patient with results of biopsy and blood test.   Since he has GAVE, he should have H&H every three to four months. If there  is evidence of continued GI blood loss and anemia, he would need APC therapy  to rid stomach of these lesions.       NR/MEDQ  D:  02/07/2005  T:  02/07/2005  Job:  [id number]   cc:   Madelin Rear. Sherwood Gambler, MD  [address]  [place]  [place] [address]  Fax: [phone]

## 2010-12-21 NOTE — H&P (Signed)
NAME:  KEVORK, JOYCE                           ACCOUNT NO.:  [id number]   MEDICAL RECORD NO.:  [id number]                   PATIENT TYPE:  INP   LOCATION:  3034                                 FACILITY:  [facility]   PHYSICIAN:  Ollen Gross. Vernell Morgans, M.D.              DATE OF BIRTH:  Dec 19, 1940   DATE OF ADMISSION:  01/22/2004  DATE OF DISCHARGE:                                HISTORY & PHYSICAL   HISTORY OF PRESENT ILLNESS:  Mr. Jewkes is a 70 year old white male who  presents with a 1-day history of periumbilical pain.  His pain has been  associated with some nausea but he denies any fevers.  His pain has actually  improved now since he had some pain medicine in the emergency department and  he was sent from [facility] down to [facility] to get a CT scan that showed early  appendicitis.  He otherwise denies nausea, vomiting, fevers, chills, chest  pain, shortness of breath, diarrhea and dysuria.  The rest of his review of  systems is unremarkable.   PAST MEDICAL HISTORY:  His past medical history is significant for:  1. Reflux.  2. Depression.  3. Hypercholesterolemia.   PAST SURGICAL HISTORY:  Past surgical history is significant for:  1. A VATS procedure by Dr. Ines Bloomer.  2. A septoplasty of his nose.   MEDICATIONS:  Medications include Prevacid, Vytorin, amitriptyline, Paxil,  aspirin and temazepam.   ALLERGIES:  Allergies are to ERYTHROMYCIN and MORPHINE.   SOCIAL HISTORY:  He denies the use of alcohol or tobacco products.   FAMILY HISTORY:  Family history is noncontributory.   PHYSICAL EXAM:  GENERAL:  In general, he is a well-developed, well-nourished  white male in no acute distress.  VITAL SIGNS:  His temperature is 97, blood pressure 152/86, pulse of 88.  SKIN:  His skin is warm and dry with no jaundice.  EYES:  His extraocular muscles are intact.  Pupils are equal, round and  reactive to light.  Sclerae nonicteric.  LUNGS:  His lungs are clear bilaterally with no use  of accessory respiratory  muscles.  HEART:  Heart reveals a regular rate and rhythm with an impulse in the left  chest.  ABDOMEN:  Abdomen is soft with mild tenderness in the periumbilical and  right lower quadrant area but no guarding or peritoneal signs.  EXTREMITIES:  No cyanosis, clubbing or edema.  PSYCHOLOGIC:  He is alert and oriented x3 with no evidence today of anxiety  or depression.   LABORATORY WORK:  Upon review of his lab work, it was significant for a  white count of 15,000.   ASSESSMENT AND PLAN:  This is a 70 year old gentleman who has what looks  like an early appendicitis on CT scan and some mild pain consistent with  this.  I have recommended he consider having an appendectomy in the  operating room.  I have explained to him in detail the risks and benefits of  the operation to remove the appendix as well as some of the technical  aspects and he understands and wishes to proceed and we will plan for this  this morning.                                                Ollen Gross. Vernell Morgans, M.D.    PST/MEDQ  D:  01/22/2004  T:  01/23/2004  Job:  [id number]

## 2010-12-21 NOTE — Op Note (Signed)
NAME:  Tyler Perkins, Tyler Perkins                           ACCOUNT NO.:  [id number]   MEDICAL RECORD NO.:  [id number]                   PATIENT TYPE:  INP   LOCATION:  3034                                 FACILITY:  [facility]   PHYSICIAN:  Ollen Gross. Vernell Morgans, M.D.              DATE OF BIRTH:  19-Apr-1941   DATE OF PROCEDURE:  01/22/2004  DATE OF DISCHARGE:  01/23/2004                                 OPERATIVE REPORT   PREOPERATIVE DIAGNOSIS:  Appendicitis.   POSTOPERATIVE DIAGNOSIS:  Appendicitis.   PROCEDURE:  Laparoscopic appendectomy.   SURGEON:  Ollen Gross. Carolynne Edouard, M.D.   ANESTHESIA:  General endotracheal anesthesia.   PROCEDURE:  After informed consent was obtained, the patient was brought to  the operating room and placed in the supine position on the operating table.  After induction of general anesthesia, the patient's abdomen was prepped  with Betadine and draped in the usual sterile manner.  The area below the  umbilicus was infiltrated with 0.25% Marcaine.  A small incision was made  with a 15 blade knife, this incision was carried down through the  subcutaneous tissue bluntly with a Kelly clamp and [employer] retractors  until the linea alba was identified.  The linea alba was incised with a 15  blade knife.  Each side was grasped with Kocher clamps and elevated  anteriorly.  The preperitoneal space was probed bluntly with a hemostat  until the peritoneum was opened and access was gained to the abdominal  cavity.  A 0 Vicryl purse-string stitch was placed in the fascia surrounding  the opening.  A Hasson cannula was placed through the opening and anchored  in place with the previously placed Vicryl purse-string stitch.  The abdomen  was then insufflated with carbon dioxide without difficulty.  The patient  was placed in Trendelenburg position and rotated slightly with the right  side up.  Next, the epigastric region was infiltrated with 0.25% Marcaine.  A small stab incision was made with a  15 blade knife and a 5 mm port was  placed bluntly through this incision into the abdominal cavity under direct  vision.  Next, the suprapubic area was infiltrated with 0.25% Marcaine.  A  small incision was made with a 15 blade knife and a 12 mm port was placed  bluntly through this incisions into the abdominal cavity under direct  vision.  The right lower quadrant was inspected.  The appendix was able to  be identified and appeared to be inflamed and enlarged.  The laparoscope was  moved to the suprapubic port.  A retractor was used to elevate the appendix.  The mesoappendix was taken down sharply with the Harmonic scalpel.  A  laparoscopic GIA stapler with the blue load was placed across the base of  the appendix at its junction with the cecum, clamped, and fired, thereby  dividing the appendix between staple lines.  An  endoscopic bag was placed  into the abdomen, the appendix was placed in the bag, and the bag was  sealed.  The abdomen was then copiously irrigated with saline.  The staple  line was inspected and found to be completely hemostatic.  The bag and  appendix was then removed with the Hasson cannula through the infraumbilical  port without difficulty.  The fascial defect was closed with the previously  placed purse-string sutures as well as with another interrupted 0 Vicryl  stitch.  The rest of the ports were removed under direct vision, gas was  allowed to escape, all the ports were hemostatic.  The fascia of the  suprapubic port was closed with interrupted 0 Vicryl stitch.  All the skin  incisions were closed with  interrupted 4-0 Monocryl subcuticular stitches.  Benzoin and Steri-Strips  were applied.  The patient tolerated the procedure well.  At the end of the  case, all needle, sponge, and instrument counts were correct.  The patient  was awakened and taken to the recovery room in stable condition.                                               Ollen Gross. Vernell Morgans,  M.D.    PST/MEDQ  D:  01/25/2004  T:  01/25/2004  Job:  [id number]

## 2010-12-21 NOTE — H&P (Signed)
[facility]  Patient:    Tyler Perkins, Tyler Perkins                        MRN: [id number] Adm. Date:  [id number] Disc. Date: [id number] Attending:  Doug Sou Dictator:   Valinda Hoar, N.P.                         History and Physical  IDENTIFYING INFORMATION:  Tyler Perkins is a 70 year old white married male admitted on a voluntary basis March 21, 2000 for "depression and anxiety." This is his third psychiatric admission.  HISTORY OF PRESENT ILLNESS:  The patient is very hyperverbal and very detailed and it is sometimes difficult to get a clear history from him.  He states that for the last two months he has become "edgy."  He states this means he has had trouble controlling his anger outbursts.  He has had crying spells.  He denies suicidal ideation. He denies homicidal ideation.  He does acknowledge that when he gets angry he yells at his wife.  He is angry at his wife right now because she refuses to communicate with him because she is afraid she will say something that will set him off and then he will become angry and yell at her. He feels like this is totally her fault and does not realize that perhaps when yelling and screaming at his wife, that it does make her somewhat afraid of him. He denies physical and verbal abuse, although he acknowledges angry outbursts which he equates to mood swings.  He apparently had a "big fight" with his daughter about a week ago.  Apparently, he started yelling at her on the phone.  Apparently, she called the police to see if he was safe. Apparently, the daughter does not want to have anything else to do with him.  He states his sleep is good as long as he takes Elavil.  He states his appetite has been poor with a weight loss of 6-7 pounds.  Apparently his wife left him to live with his daughter for a week or so.  He states they have not separated.  There is no intention of separation and they plan to stay married. He  supposedly has a "chemical imbalance."  He tends to project all of the blame on his wife.  His wife wont support him, wont listen to him, wont communicate with him.  He says he went to his family doctor yesterday who sent him to [facility] ED and he states he felt if he did not get help soon he was going to go over the edge.  He states if he waited for help he would be emotionally upset and back in the hospital even sooner.  Again, he says his main concerns are mood swings, anger, his rages and depression.  He denies any previous suicide attempts.  He states he is tired of worrying all the time.  He has had marital problems, it sounds like, for at least ten years and again, he says the wife is reluctant to communicate with him; although, when she does, he apparently is in her face and yells at her.  He appears to be in some denial about his behavior, particularly toward his wife and family.  PAST PSYCHIATRIC HISTORY:  The patient has been at [facility] 25 years ago for the same thing, what he calls "anger and mood swings."  [facility] of [address]  seven years ago for "mood swings."  His medications are Elavil, Klonopin.  He did take Valium, however, he states he had no response to that. He does not know of any other psychotropic medication he has been on.  PAST MEDICAL HISTORY:  The patient sees Dr. Luciana Axe in [place]. He last saw him March 20, 2000.  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  MEDICATIONS:  Elavil 150 mg q.hs.  He has been on this for 25 years.  Klonopin 0.5 mg q.id.  He has been on this for four years.  He states he thought Klonopin was an antidepressant.  He did not realize it was an antianxiety agent.  Prevacid, question of what dose, but he takes it ac breakfast.  If he does not take it, he does get indigestion.  He apparently was on Vioxx for  his neck pain and the Vioxx was stopped March 08, 2000.  DRUG ALLERGIES:  ERYTHROMYCIN, CODEINE, DECADRON.  SOCIAL HISTORY:  The patient has been married 36 years.  His wife moved out for a week to live with the daughter.  His anxiety caused stress for her but it appears his yelling and verbal outbursts at her caused her to leave. He states his wife does worry about him and again, he acknowledges marital problems.  Wife is afraid to communicate with the husband due to his verbal yelling.  He has one daughter and one son, both adopted.  Parents are living with health problems.  He has one brother.  He completed high school and has a associate degree in business management.  He retired 7-1/2 years ago from [employer] after being there 27 years.  No financial problems.  FAMILY HISTORY:  He states his mother and father have problems with depression and anxiety.  ALCOHOL/DRUG HISTORY:  He denies alcohol abuse.  He denies substance abuse, including prescription drug abuse.  He states he quit smoking for the last 15 months, however, he has been smoking less than a pack of cigarettes a day for the past week.  PHYSICAL FINDINGS:  He has been seen in the [facility] Emergency Department March 20, 2000 for a physical exam. Please see their physical findings.  No significant problems.  Temperature 97.0, pulse 905, respirations 20, blood pressure 130/81, height 5 feet 10 inches, weight 180 pounds.  His CMET was within normal limits and hematocrit was 44%.  Urine blood screen was positive for benzodiazepines.  He is on Klonopin.  Alcohol was less than 10.  CURRENT MENTAL STATUS EXAMINATION:  The patient is a casually dressed white male who is quite anxious, always in movement, however, he attempts to be cooperative.  Speech - very talkative, verbal.  Speech is detailed almost to the point of being circumstantial.  Mood is very anxious.  Affect is anxious, slightly labile.  He denies  suicidal ideation.  He denies homicidal ideation. Thought processes are logical and coherent without evidence of psychosis. Cognitive - alert and oriented.  Cognitive functioning appears to be intact.  He appears to have poor impulse control, poor insight and poor judgment.  CURRENT DIAGNOSES: Axis I:   1. Anxiety disorder, not otherwise specified.           2. Benzodiazepine dependence.           3. Explosive personality disorder. Axis II:  Deferred. Axis III: 1. Degenerative joint disease.  2. Gastroesophageal reflux disease.           3. Seasonal allergies. axis IV:  Severe, related to problems with primary support group and marital           problems. Axis V:   Current global assessment of functioning 39, highest past year 41.  TREATMENT PLAN AND RECOMMENDATION:  Voluntary admission to [facility] Unit.  Check every 15 minutes, maintain safety.  I think we need to have a family session with the patients wife as soon as possible to determine is his behavior has only occurred three times in the 36 years of marriage or if this is his typical behavior.  Will start him on Prevacid 30 mg ac breakfast, consider detox off Klonopin, consider discontinuing Elavil. Will continue his Claritin 10 mg p.o. q.a.m.  Will consult with Dr. Claudette Head regarding his medications.  TENTATIVE LENGTH OF STAY AND DISCHARGE PLANS:  One-3 days. DD:  03/21/00 TD:  03/22/00 Job: [id number]

## 2010-12-21 NOTE — H&P (Signed)
Tyler Perkins, Tyler Perkins                 ACCOUNT NO.:  [id number]   MEDICAL RECORD NO.:  [id number]          PATIENT TYPE:  INP   LOCATION:  A332                          FACILITY:  [facility]   PHYSICIAN:  Lionel December, M.D.    DATE OF BIRTH:  [birth year]   DATE OF ADMISSION:  02/08/2005  DATE OF DISCHARGE:  LH                                HISTORY & PHYSICAL   PRESENTING COMPLAINT:  Severe midabdominal pain of acute onset.   Patient is status post colonoscopic polypectomy yesterday.   Tyler Perkins is 70 year old Caucasian male who underwent EGD/ED and colonoscopy  yesterday, the details of which are reviewed under past medical history.  The patient is a 70 year old Caucasian male who recently presented to the  office with chest pressure/dysphagia.  He was noted to have Hemoccult-  positive stools.  He has chronic GERD and has been maintained on PPI.  Switching around to different PPIs did not make any difference.  He had EGD  and colonoscopy yesterday.  EGD reveals gastric antral vascular ectasia  which are not bleeding and therefore not treated.  He had single small polyp  at transverse colon which was snared.  Most of the polyp was coagulated.  Patient left the facility without any problems.  He had no pain or other  symptoms the rest of the day.  This morning he woke up and had his breakfast  as usual.  Around 9:30 or 9:45 a.m., he suddenly noted pain in his  midabdomen.  He felt diaphoretic.  He did not have nausea or vomiting,  fever, or rectal bleeding.  Patient called me.  Patient was asked to come to  emergency room for further evaluation.  He described pain to be 10/10.  He  points to umbilicus as site of most of the pain.   He is not having any chest pain or dyspnea.   He is on Nexium 40 mg b.i.d., Paxil 20 mg once daily, temazepam 30 mg at  bedtime, aspirin  81 mg once daily, FiberCon two tablets once daily,  amitriptyline 100 mg at bedtime.   PAST MEDICAL HISTORY:  Chronic GERD.   His symptoms have been well controlled  with therapy except he has dysphagia and __________ recently which he  underwent EGD/ED.   History of colonic polyps.  He had a small adenoma removed from his  transverse colon 3 years ago.   He has hearing impairment.  He had right thoracotomy with removal of two  enlarged lymph nodes which were benign.  He had appendectomy 1 year ago and  he also had surgery for deviated nasal septum in 1970.  He has insomnia as  well as history of IBS.  He presented with rectal about 2 years ago and  extensive workup including MRI of his sacral area as well as pelvic CT were  negative.  He had evaluation at [facility] ER which is negative.  He states he  saw a chiropractor and had some back alignment which has helped his pain.   History of stress disorder and insomnia.   ALLERGIES:  MORPHINE  and ERYTHROMYCIN.   PHYSICAL EXAMINATION:  Well-developed, well-nourished Caucasian male who  appears to be in pain.  Admission weight 163.1 pounds, he is 5 feet 9 inches  tall, pulse 89 per minute, blood pressure 132/79, respiratory rate is 26 and  temperature is 97.6.  Conjunctivae is pink.  Sclerae is nonicteric.  Oropharyngeal mucosa normal.  No neck masses are noted.  Cardiac exam with a  regular rhythm, normal S1, S3, no murmur or gallop noted.  Lungs clear to  auscultation.  His abdomen is symmetrical, bowel sounds are normal.  On  palpation he guards diffusely.  He is tender mainly in periumbilical area.  No rebound noted.  Rectal exam is deferred.  There is no peripheral edema or  clubbing.   Acute abdominal series reviewed.  There is nonspecific gas pattern primarily  in the small bowel and some in the colon but no pneumoperitoneum noted.   LABORATORIES:  WBC 16.2, H&H is 15.1 and 43, platelet count is 265K.  Differential revealed 66% neutrophils.   ASSESSMENT:  A 70 year old Caucasian male who had colonoscopy with snare  polypectomy yesterday who presents with  sudden onset of periumbilical pain  almost 20 hours post procedure.  He does not have pneumoperitoneum.  He does  have leukocytosis.  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   PLAN:  We will admit him for IV fluids and antibiotic therapy and pain  control.   He will have CBC and MET-7 in the morning.   Unless there is significant improvement in the next 24 hours, we will  consider abdominopelvic CT.       NR/MEDQ  D:  02/08/2005  T:  02/08/2005  Job:  [id number]   cc:   Madelin Rear. Sherwood Gambler, MD  [address]  [place]  [place] [address]  Fax: [phone]

## 2010-12-21 NOTE — Assessment & Plan Note (Signed)
HEALTHCARE                               PULMONARY OFFICE NOTE   NAME:Perkins, Tyler ROYSTER                        MRN:          [id number]  DATE:05/06/2006                            DOB:          1941-02-25    PROBLEMS:  1. Abnormal chest CT/nodule/tracheal defect.  2. Tobacco abuse.  3. Mild chronic obstructive pulmonary disease.   HISTORY:  He stopped smoking 13 months ago. He has been walking regularly in  a rehabilitation program at the hospital treating back pain and says he does  not get short of breath with that. Chronic low back pain related to  degenerative disk disease; is bad enough, he says, he may need surgery for  it. He has felt more aware of shortness of breath with sitting than quiet  rest in any position over the last two weeks. He may also notice it while  walking. It has been fairly constant with no chest pain, palpitation, cough,  or phlegm. No leg swelling or pain. He says he has been told repeatedly over  the years that he holds his breath. He is taking Talacen for pain.  Hospitalized a week ago for vertigo and had a CAT scan of the head. He says  he had an MRI of his brain two months ago, but does not remember what that  was for.   MEDICATIONS:  1. Nexium.  2. Paxil 20 mg.  3. Aspirin 81 mg.  4. Vytorin 10 mg.  5. Amitriptyline 100 mg at HS.  6. Temazepam 30 mg at HS.  7. Talacen p.r.n.   ALLERGIES:  DRUG INTOLERANCE TO CODEINE, MORPHINE, AND ERYTHROMYCIN.   OBJECTIVE:  VITAL SIGNS:  Weight 179, blood pressure 122/78, pulse regular  110, room air saturation 94%.  GENERAL:  He seems in no distress. Bilateral hearing aids, unlabored  breathing.  HEART:  Regular without murmur, gallop or rub.  NECK:  No neck vein distention or strider.  EXTREMITIES:  No peripheral edema, cyanosis or clubbing. Denna Haggard is negative.  LUNGS:  Fields are quiet and clear without wheeze, rales or rhonchi.   IMPRESSION:  I am not sure why he  feels more dyspneic, and we discussed the  possibility that this was a reflection of chest wall tightness from his back  pain. I do not see evidence that there has been a cardiac change, pulmonary  embolism or acute infection. He admits that the rehabilitation program may  be causing some muscle tightness. He declines flu shot.   PLAN:  Blood for CBC to exclude anemia. Chest x-ray was done (by my reading,  it shows stable scarring changes in the right lung base with no acute  process, effusion, or significant  lesion otherwise). The chest x-ray is being sent for over read. We will  discuss findings, but I do not find clear evidence of anything new going on.       Clinton D. Maple Hudson, MD, FCCP, FACP      CDY/MedQ  DD:  05/06/2006  DT:  05/08/2006  Job #:  [id number]   cc:  Weyman Pedro

## 2011-01-11 ENCOUNTER — Encounter: Payer: Self-pay | Admitting: Cardiology

## 2011-01-14 ENCOUNTER — Encounter: Payer: Self-pay | Admitting: Cardiology

## 2011-01-14 ENCOUNTER — Ambulatory Visit (INDEPENDENT_AMBULATORY_CARE_PROVIDER_SITE_OTHER): Payer: Medicare Other | Admitting: Cardiology

## 2011-01-14 ENCOUNTER — Encounter: Payer: Self-pay | Admitting: *Deleted

## 2011-01-14 DIAGNOSIS — I779 Disorder of arteries and arterioles, unspecified: Secondary | ICD-10-CM | POA: Insufficient documentation

## 2011-01-14 DIAGNOSIS — J449 Chronic obstructive pulmonary disease, unspecified: Secondary | ICD-10-CM

## 2011-01-14 DIAGNOSIS — G4733 Obstructive sleep apnea (adult) (pediatric): Secondary | ICD-10-CM

## 2011-01-14 DIAGNOSIS — R0602 Shortness of breath: Secondary | ICD-10-CM

## 2011-01-14 DIAGNOSIS — E7800 Pure hypercholesterolemia, unspecified: Secondary | ICD-10-CM

## 2011-01-14 NOTE — Assessment & Plan Note (Signed)
Followed by Dr. Young. 

## 2011-01-14 NOTE — Assessment & Plan Note (Signed)
Reportedly left-sided, degree uncertain, based on previous carotid Dopplers at [facility] internal medicine. Records requested.

## 2011-01-14 NOTE — Progress Notes (Signed)
Clinical Summary Tyler Perkins is a 70 y.o.male referred for cardiology consultation by Dr. Sherril Croon. He is here with his wife today. He describes a fairly long-standing, 6-8 month history, of exertional shortness of breath. Prior to this he was walking approximately 30 minutes at a time most days of the week with NYHA class II symptoms. He has not been exercising regularly over the last several months. He also describes an episode of significant sweating, although not clearly with exertion. He feels short of breath with activities such as lifting an object, pushing his trash can to the street. Some feeling of chest "fullness" however this is not clearly exertional. He reports a stress test at [facility] internal medicine a few years ago that was reportedly normal.  Also history of apparent left carotid disease, details not available as yet. Carotid Doppler was done at [facility] internal medicine by report. Patient also describes an echocardiogram done a few weeks ago at [facility] internal medicine.  Patient reports annual follow up with Dr. Maple Hudson for pulmonary assessment. No indication of significant wheezing or cough.  Allergies  Allergen Reactions  . Codeine   . Erythromycin   . Hydromorphone Hcl   . Morphine   . Lyrica (Pregabalin) Swelling    Current outpatient prescriptions:amitriptyline (ELAVIL) 75 MG tablet, Take 75 mg by mouth at bedtime.  , Disp: , Rfl: ;  Artificial Tear Solution (TEARS NATURALE II) SOLN, Place 1 drop into both eyes 4 (four) times daily.  , Disp: , Rfl: ;  aspirin (BAYER LOW STRENGTH) 81 MG EC tablet, Take 81 mg by mouth daily.  , Disp: , Rfl: ;  esomeprazole (NEXIUM) 40 MG capsule, Take 40 mg by mouth 2 (two) times daily. , Disp: , Rfl:  ezetimibe-simvastatin (VYTORIN) 10-80 MG per tablet, Take 1 tablet by mouth daily.  , Disp: , Rfl: ;  ketoprofen (ORUDIS) 75 MG capsule, 1 capsule Three times a day., Disp: , Rfl: ;  Multiple Vitamin (MULTIVITAMIN) tablet, Take 1 tablet by mouth daily.  , Disp:  , Rfl: ;  PARoxetine (PAXIL) 20 MG tablet, Take 20 mg by mouth daily.  , Disp: , Rfl:  sodium chloride (OCEAN) 0.65 % nasal spray, Place 2 sprays into the nose 6 (six) times daily.  , Disp: , Rfl: ;  temazepam (RESTORIL) 30 MG capsule, Take 30 mg by mouth at bedtime as needed.  , Disp: , Rfl: ;  timolol (TIMOPTIC) 0.5 % ophthalmic solution, Place 1 drop into both eyes Daily., Disp: , Rfl: ;  VOLTAREN 1 % GEL, Apply 1 application topically 4 times daily., Disp: , Rfl:  DISCONTD: gabapentin (NEURONTIN) 300 MG capsule, Take 300 mg by mouth 2 (two) times daily.  , Disp: , Rfl: ;  DISCONTD: meloxicam (MOBIC) 7.5 MG tablet, Take 7.5 mg by mouth 2 (two) times daily.  , Disp: , Rfl:   Past Medical History  Diagnosis Date  . Lung nodule   . COPD (chronic obstructive pulmonary disease)   . Carotid arterial disease   . Obstructive sleep apnea   . GERD (gastroesophageal reflux disease)   . Mixed hyperlipidemia     Past Surgical History  Procedure Date  . Cataract extraction   . Lymph node dissection 2007    VATS resection intraparenchymal lymph node  . Deviated septum repair   . Laparoscopic appendectomy   . Retinal detachment repair w/ scleral buckle le     Family History  Problem Relation Age of Onset  . Pneumonia Father   .  Heart failure Father   . Other Mother     Vocal cord paralysis, sleeps with BIPAP  . Coronary artery disease Brother     CABG on his 2s    Social History Mr. Imran reports that he quit smoking about 3 years ago. His smoking use included Cigarettes. He has a 30 pack-year smoking history. He has never used smokeless tobacco. Mr. Jessop reports that he does not drink alcohol.  Review of Systems Otherwise reviewed and negative except as outlined.  Physical Examination Filed Vitals:   01/14/11 0907  BP: 124/79  Pulse: 92  Overweight male in no acute distress. HEENT: Conjunctiva and lids normal, oropharynx with moist mucosa. Neck: Supple, no elevated JVP or  significant carotid bruit. Lungs: Diminished but clear breath sounds, nonlabored. Cardiac: Regular rate and rhythm, no significant murmur or gallop. Abdomen: Soft, nontender, bowel sounds present. Skin: No rashes or ulcerations. Extremities: No pitting edema, distal pulses full. Musculoskeletal: No kyphosis. Neuropsychiatric: Alert and oriented x3, affect grossly appropriate.   ECG Normal sinus rhythm at 86 beats per minute with PR interval 216 ms.   Problem List and Plan

## 2011-01-14 NOTE — Assessment & Plan Note (Signed)
Record review notes fairly long-standing history, however patient states that this has been worse within the last 6-8 months. He has not been exercising as regularly during this time however. No reported increase in cough or wheezing. Also experiences a "fullness" in his chest intermittently, not specifically with exertion. He does have some family history of CAD, other cardiac risk factors including age and gender, hyperlipidemia, potentially some degree of carotid artery disease. Plan at this point is to obtain the echocardiogram and carotid Doppler results from [facility] internal medicine, also proceed with an exercise Cardiolite. Further plans to follow.

## 2011-01-14 NOTE — Assessment & Plan Note (Signed)
On statin therapy, followed by Dr. Vyas. 

## 2011-01-14 NOTE — Patient Instructions (Signed)
Follow up as scheduled. Your physician has requested that you have en exercise stress cardiolite. For further information please visit [email or web address] Please follow instruction sheet, as given. Your physician recommends that you continue on your current medications as directed. Please refer to the Current Medication list given to you today.

## 2011-01-18 ENCOUNTER — Telehealth: Payer: Self-pay | Admitting: *Deleted

## 2011-01-18 NOTE — Telephone Encounter (Signed)
Pt has Medicare and Medicare Supplement - [employer].  No precert required.

## 2011-01-18 NOTE — Telephone Encounter (Signed)
Pt notified of appt date/time and reminded of instructions.

## 2011-01-18 NOTE — Telephone Encounter (Signed)
Emailed to [employer] at [facility].

## 2011-01-18 NOTE — Telephone Encounter (Signed)
Stress Cardiolite at [facility] [address] Tuesday, January 22, 2011 Arrive at 7:00 am  Checking pre-cert

## 2011-01-22 DIAGNOSIS — R079 Chest pain, unspecified: Secondary | ICD-10-CM

## 2011-02-12 ENCOUNTER — Encounter: Payer: Self-pay | Admitting: Cardiology

## 2011-02-14 ENCOUNTER — Ambulatory Visit (INDEPENDENT_AMBULATORY_CARE_PROVIDER_SITE_OTHER): Payer: Medicare Other | Admitting: Cardiology

## 2011-02-14 ENCOUNTER — Encounter: Payer: Self-pay | Admitting: Cardiology

## 2011-02-14 DIAGNOSIS — E7800 Pure hypercholesterolemia, unspecified: Secondary | ICD-10-CM

## 2011-02-14 DIAGNOSIS — R0989 Other specified symptoms and signs involving the circulatory and respiratory systems: Secondary | ICD-10-CM

## 2011-02-14 DIAGNOSIS — I779 Disorder of arteries and arterioles, unspecified: Secondary | ICD-10-CM

## 2011-02-14 NOTE — Assessment & Plan Note (Signed)
Reviewed report of Doppler studies from 2010 suggesting no obstructive carotid stenoses.

## 2011-02-14 NOTE — Patient Instructions (Signed)
Your physician wants you to follow-up in: 6 months. You will receive a reminder letter in the mail one-two months in advance. If you don't receive a letter, please call our office to schedule the follow-up appointment. Your physician recommends that you continue on your current medications as directed. Please refer to the Current Medication list given to you today. 

## 2011-02-14 NOTE — Assessment & Plan Note (Signed)
Followed by Dr. Sherril Croon, LDL 75 recently.

## 2011-02-14 NOTE — Assessment & Plan Note (Signed)
Reportedly somewhat better. Recent Cardiolite argues against any major degree of obstructive atherosclerotic disease, and therefore recommend aggressive medical therapy, exercise, and observation for now.

## 2011-02-14 NOTE — Progress Notes (Signed)
Clinical Summary Tyler Perkins is a 70 y.o.male seen recently in consultation with shortness of breath. Followup Cardiolite is noted below, reassuring. We discussed this today.  Echocardiogram report done at [facility] internal medicine back in May was reviewed. Study indicated mild LVH with normal EF but diastolic dysfunction, MAC with trace mitral regurgitation. Previous carotid Doppler results from 2010 suggested no flow-limiting stenoses.  He states that he has felt somewhat better, actually less short of breath after beginning a more regular exercise regimen within the last few weeks. We discussed the fact that given his risk factor profile and family history, he likely has some degree of underlying atherosclerotic disease. At this point his noninvasive imaging would argue for aggressive medical therapy and observation. He was comfortable with this.  Recent lab work from June showed total cholesterol 164, triglycerides 133, HDL 62, LDL 75. AST and ALT were mildly elevated, and he has pending follow up with Tyler Perkins for this.   Allergies  Allergen Reactions  . Codeine   . Erythromycin   . Hydromorphone Hcl   . Morphine   . Lyrica (Pregabalin) Swelling    Current outpatient prescriptions:amitriptyline (ELAVIL) 75 MG tablet, Take 75 mg by mouth at bedtime.  , Disp: , Rfl: ;  Artificial Tear Solution (TEARS NATURALE II) SOLN, Place 1 drop into both eyes 4 (four) times daily.  , Disp: , Rfl: ;  aspirin (BAYER LOW STRENGTH) 81 MG EC tablet, Take 81 mg by mouth daily.  , Disp: , Rfl: ;  esomeprazole (NEXIUM) 40 MG capsule, Take 40 mg by mouth 2 (two) times daily. , Disp: , Rfl:  ezetimibe-simvastatin (VYTORIN) 10-80 MG per tablet, Take 1 tablet by mouth daily.  , Disp: , Rfl: ;  Multiple Vitamin (MULTIVITAMIN) tablet, Take 1 tablet by mouth daily.  , Disp: , Rfl: ;  PARoxetine (PAXIL) 20 MG tablet, Take 20 mg by mouth daily.  , Disp: , Rfl: ;  sodium chloride (OCEAN) 0.65 % nasal spray, Place 2 sprays into  the nose 6 (six) times daily.  , Disp: , Rfl:  temazepam (RESTORIL) 30 MG capsule, Take 30 mg by mouth at bedtime as needed.  , Disp: , Rfl: ;  timolol (TIMOPTIC) 0.5 % ophthalmic solution, Place 1 drop into both eyes Daily., Disp: , Rfl:   Past Medical History  Diagnosis Date  . Lung nodule   . COPD (chronic obstructive pulmonary disease)   . Carotid arterial disease   . Obstructive sleep apnea   . GERD (gastroesophageal reflux disease)   . Mixed hyperlipidemia     Past Surgical History  Procedure Date  . Cataract extraction   . Lymph node dissection 2007    VATS resection intraparenchymal lymph node  . Deviated septum repair   . Laparoscopic appendectomy   . Retinal detachment repair w/ scleral buckle le     Family History  Problem Relation Age of Onset  . Pneumonia Father   . Heart failure Father   . Other Mother     Vocal cord paralysis, sleeps with BIPAP  . Coronary artery disease Brother     CABG on his 23s    Social History Tyler Perkins reports that he quit smoking about 3 years ago. His smoking use included Cigarettes. He has a 30 pack-year smoking history. He has never used smokeless tobacco. Tyler Perkins reports that he does not drink alcohol.  Review of Systems Otherwise reviewed and negative.  Physical Examination Filed Vitals:   02/14/11 1335  BP: 119/80  Pulse: 81   Overweight male in no acute distress.  HEENT: Conjunctiva and lids normal, oropharynx with moist mucosa.  Neck: Supple, no elevated JVP or significant carotid bruit.  Lungs: Diminished but clear breath sounds, nonlabored.  Cardiac: Regular rate and rhythm, no significant murmur or gallop.  Abdomen: Soft, nontender, bowel sounds present.  Skin: No rashes or ulcerations.  Extremities: No pitting edema, distal pulses full.  Musculoskeletal: No kyphosis.  Neuropsychiatric: Alert and oriented x3, affect grossly appropriate.   Studies Exercise Cardiolite 01/22/2011: No diagnostic ST segment  changes, fixed inferior defect consistent with attenuation, no ischemia, LVEF 58%. Normal TID Ratio.   Problem List and Plan

## 2011-02-15 ENCOUNTER — Encounter: Payer: Self-pay | Admitting: Cardiology

## 2011-02-25 ENCOUNTER — Encounter: Payer: Self-pay | Admitting: Internal Medicine

## 2011-02-25 ENCOUNTER — Ambulatory Visit (INDEPENDENT_AMBULATORY_CARE_PROVIDER_SITE_OTHER): Payer: Medicare Other | Admitting: Internal Medicine

## 2011-02-25 ENCOUNTER — Ambulatory Visit (INDEPENDENT_AMBULATORY_CARE_PROVIDER_SITE_OTHER)
Admission: RE | Admit: 2011-02-25 | Discharge: 2011-02-25 | Disposition: A | Discharge status: Home / Self Care | Payer: Medicare Other | Source: Ambulatory Visit | Attending: Internal Medicine | Admitting: Internal Medicine

## 2011-02-25 VITALS — BP 120/80 | HR 100 | Ht 70.0 in | Wt 197.0 lb

## 2011-02-25 DIAGNOSIS — J449 Chronic obstructive pulmonary disease, unspecified: Secondary | ICD-10-CM

## 2011-02-25 DIAGNOSIS — Z87891 Personal history of nicotine dependence: Secondary | ICD-10-CM

## 2011-02-25 DIAGNOSIS — G4733 Obstructive sleep apnea (adult) (pediatric): Secondary | ICD-10-CM

## 2011-02-25 DIAGNOSIS — R0609 Other forms of dyspnea: Secondary | ICD-10-CM

## 2011-02-25 NOTE — Patient Instructions (Signed)
Order- CXR  Dx COPD   

## 2011-02-25 NOTE — Progress Notes (Signed)
Subjective:    Patient ID: Tyler Perkins, male    DOB: 04/07/1941, 70 y.o.   MRN: [id number]  HPI 02/25/11- 70 yoM former smoker (45 pack years) followed for COPD, hx lung nodule, OSA,complicated by chronic back pain.     Wife here    PCP Dr Sherril Croon Last here February 23, 2010 He has been concerned about exertional dyspnea. Quit smoking 3 years ago. Seen in June for cardiology assessment by Dr Diona Browner because of exertional dyspnea. Told stress test didn't show basis to justify cath, and that he should walk more aggressively. He is trying to do that, and says exercise tolerance is getting a little better. Scheduled for ablation procedure for his back pain. Has lost 10 lbs. CPAP 14- long term autoPAP 8-20. Able to use it all night every night. Prevents snore.  Review of Systems Constitutional:   No-   , night sweats, fevers, chills, fatigue, lassitude. HEENT:   No-   headaches, difficulty swallowing, tooth/dental problems, sore throat,                  No-   sneezing, itching, ear ache, nasal congestion, post nasal drip,   CV:  No-   chest pain, orthopnea, PND, swelling in lower extremities, anasarca, dizziness, palpitations  GI:  No-   heartburn, indigestion, abdominal pain, nausea, vomiting, diarrhea,                 change in bowel habits, loss of appetite  o-  excess mucus,             No-   productive cough,  No non-productive cough,  No-  coughing up of blood.              No-   change in color of mucus.  No- wheezing.    Skin: No-   rash or lesions.  GU: No-   dysuria, change in color of urine, no urgency or frequency.  No- flank pain.  MS:  No-   joint pain or swelling.  No- decreased range of motion.  No- back pain.  Psych:  No- change in mood or affect. No depression or anxiety.  No memory loss.      Objective:   Physical Exam General- Alert, Oriented, Affect-appropriate, Distress- none acute  Medium build, very relaxed Skin- rash-none, lesions- none, excoriation-  none Lymphadenopathy- none Head- atraumatic            Eyes- Gross vision intact, PERRLA, conjunctivae clear secretions            Ears- Hearing, canals            Nose- Clear, no-Septal dev, mucus, polyps, erosion, perforation             Throat- Mallampati IV , mucosa clear , drainage- none, tonsils- atrophic Neck- flexible , trachea midline, no stridor , thyroid nl, carotid no bruit Chest - symmetrical excursion , unlabored           Heart/CV- RRR , no murmur , no gallop  , no rub, nl s1 s2                           - JVD- none , edema- none, stasis changes- none, varices- none           Lung- clear to P&A, wheeze- none, cough- none , dullness-none, rub- none  Chest wall-  Abd- tender-no, distended-no, bowel sounds-present, HSM- no Br/ Gen/ Rectal- Not done, not indicated Extrem- cyanosis- none, clubbing, none, atrophy- none, strength- nl Neuro- grossly intact to observation         Assessment & Plan:

## 2011-02-25 NOTE — Assessment & Plan Note (Signed)
Good compliance and control 

## 2011-02-25 NOTE — Assessment & Plan Note (Addendum)
He has known mild COPD, but likely Dr Diona Browner advised him correctly that he needed to exercise more. His 10 lb weight loss is likely signinficant also. Updating CXR

## 2011-02-26 ENCOUNTER — Encounter: Payer: Self-pay | Admitting: Internal Medicine

## 2011-02-26 NOTE — Progress Notes (Signed)
Quick Note:  Pt aware of results. ______ 

## 2011-02-26 NOTE — Assessment & Plan Note (Signed)
Mild obstructive airways disease. FEV1/FVC 0.74  2006

## 2011-05-21 LAB — CBC
HCT: 40.7
Hemoglobin: 13.9
MCHC: 34.1
MCV: 85.9
RBC: 4.73
WBC: 8.1

## 2011-06-14 ENCOUNTER — Telehealth (INDEPENDENT_AMBULATORY_CARE_PROVIDER_SITE_OTHER): Payer: Self-pay | Admitting: Internal Medicine

## 2011-06-14 NOTE — Telephone Encounter (Signed)
Would like Dr. Patty Sermons nurse to give him a call. He needs to speak with her about his medication. Please call [phone].

## 2011-06-20 NOTE — Telephone Encounter (Signed)
I called the patient and left a message to be called back. Explained if I was not available to ask for Dorene Ar, NP, that she ,may be able to help with the question.

## 2011-06-24 ENCOUNTER — Telehealth (INDEPENDENT_AMBULATORY_CARE_PROVIDER_SITE_OTHER): Payer: Self-pay | Admitting: Internal Medicine

## 2011-06-24 NOTE — Telephone Encounter (Signed)
Please return the call to  [phone]. Has a question about a medication.

## 2011-06-26 NOTE — Telephone Encounter (Signed)
I have talked with the patient . He will stay on current PPI until completed , and then he will go on Pantoprazole as recommended by Dr. Karilyn Cota. Tyler Perkins stated that he will call us when it is time.

## 2011-09-30 ENCOUNTER — Ambulatory Visit (INDEPENDENT_AMBULATORY_CARE_PROVIDER_SITE_OTHER): Payer: Medicare Other | Admitting: Internal Medicine

## 2011-09-30 ENCOUNTER — Ambulatory Visit (INDEPENDENT_AMBULATORY_CARE_PROVIDER_SITE_OTHER): Payer: Medicare Other

## 2011-09-30 ENCOUNTER — Encounter (INDEPENDENT_AMBULATORY_CARE_PROVIDER_SITE_OTHER): Payer: Self-pay | Admitting: Internal Medicine

## 2011-09-30 DIAGNOSIS — R197 Diarrhea, unspecified: Secondary | ICD-10-CM

## 2011-09-30 DIAGNOSIS — K219 Gastro-esophageal reflux disease without esophagitis: Secondary | ICD-10-CM

## 2011-09-30 DIAGNOSIS — K6289 Other specified diseases of anus and rectum: Secondary | ICD-10-CM | POA: Insufficient documentation

## 2011-09-30 MED ORDER — NYSTATIN-TRIAMCINOLONE 100000-0.1 UNIT/GM-% EX CREA: TOPICAL_CREAM | Freq: Two times a day (BID) | CUTANEOUS | Status: DC

## 2011-09-30 MED ORDER — OMEPRAZOLE-SODIUM BICARBONATE 40-1100 MG PO CAPS: 1.0000 | ORAL_CAPSULE | Freq: Two times a day (BID) | ORAL | Status: AC

## 2011-09-30 MED ORDER — PSYLLIUM 28 % PO PACK: 1.0000 | PACK | Freq: Two times a day (BID) | ORAL | Status: DC

## 2011-09-30 NOTE — Progress Notes (Signed)
Presenting complaint; Followup for GERD. Patient complains of diarrhea and perianal itching. Subjective: Patient is 71 year old Caucasian male who presents with multiple symptoms. He was last seen in February 2012 and he had surveillance colonoscopy. He had a small tubular adenoma removed. He also had external hemorrhoids. Because of cost reasons he was switched from Nexium to pantoprazole. He states pantoprazole is not working as well as Nexium did. He is having intermittent heartburn but he denies dysphagia nausea or vomiting. He remains with intermittent hoarseness. For the last few months he has noted change in his bowel habits with flatulence and diarrhea. He is having 2-3 bowel movements per day. He denies melena or rectal bleeding. He also denies nocturnal diarrhea. His appetite is very good. He continues to complain of dry mouth but it is not as bad as it used to be. He also complains of intense perirectal burning and itching. This started along with his diarrhea. Current Medications: Current Outpatient Prescriptions  Medication Sig Dispense Refill  . amitriptyline (ELAVIL) 75 MG tablet Take 50 mg by mouth at bedtime.       . Artificial Tear Solution (TEARS NATURALE II) SOLN Place 1 drop into both eyes 3 (three) times daily.       Marland Kitchen aspirin (BAYER LOW STRENGTH) 81 MG EC tablet Take 81 mg by mouth daily.        . fenofibrate 160 MG tablet Take 160 mg by mouth daily.      . Multiple Vitamin (MULTIVITAMIN) tablet Take 1 tablet by mouth daily.        . pantoprazole (PROTONIX) 40 MG tablet Take 40 mg by mouth 2 (two) times daily.      Marland Kitchen PARoxetine (PAXIL) 20 MG tablet Take 20 mg by mouth daily.        . simvastatin (ZOCOR) 80 MG tablet Take 80 mg by mouth at bedtime.      . sodium chloride (OCEAN) 0.65 % nasal spray Place 2 sprays into the nose 6 (six) times daily.        . temazepam (RESTORIL) 30 MG capsule Take 30 mg by mouth at bedtime as needed.        . timolol (TIMOPTIC) 0.5 %  ophthalmic solution Place 1 drop into both eyes Daily.        Objective: Blood pressure 120/80, pulse 76, temperature 97 F (36.1 C), temperature source Oral, resp. rate 16, height 5\' 9"  (1.753 m), weight 194 lb (87.998 kg).  Conjunctiva is pink. Sclera is nonicteric Oro pharyngeal mucosa is is dry but no ulcers noted. No neck masses or thyromegaly noted. Cardiac exam with regular rhythm normal S1 and S2. No murmur or gallop noted. Lungs are clear to auscultation. Abdomen abdomen is symmetrical. Bowel sounds are normal. Palpation reveals soft abdomen with mild midepigastric tenderness. No organomegaly or masses noted. Rectal examination was limited to external inspection he has perianal erythema without skin excoriation  No LE edema or clubbing noted.  Assessment: #1 intermittent diarrhea. Suspect he may have IBS or this symptom could be secondary to pantoprazole. Onset of his diarrhea coincides with time when he was begun on pantoprazole. His last colonoscopy was one year ago as noted above. #2. Chronic GERD. Symptoms not well controlled with pantoprazole will try him on another agent. #3. Pruritus ani possibly associated with diarrhea.   Plan: Stop pantoprazole. Start Zegrid  40 mg by mouth twice a day. Patient advised about sodium content of Zegrid; should he have problems with fluid retention he  would give [place] a call. Metamucil 4 g by mouth daily. Mycolog-II cream to be applied to perianal area twice a day for 2 weeks and thereafter on when necessary basis. Patient will call us with  progress report in 2 weeks. Unless symptoms persist he will return for office visit in one year. Please note patient was handed over to prescription as he did not want these to be  e-prescribed.

## 2011-09-30 NOTE — Patient Instructions (Addendum)
Call with progress report in 2 weeks 

## 2011-10-18 ENCOUNTER — Telehealth (INDEPENDENT_AMBULATORY_CARE_PROVIDER_SITE_OTHER): Payer: Self-pay | Admitting: *Deleted

## 2011-10-18 NOTE — Telephone Encounter (Signed)
The patient called the office on 10-17-11. He states that the Pharmacy had told him that his Pharmacy told him that Insurance would cover Omeprazole. He states that the Pantoprazole is not working,  I ask D. Rehman and he ask that I call in Omeprazole 20 mg , patient to take 2 by mouth every day. #60 , 11 refills. This was called to [facility] and the patient was made aware.

## 2011-11-15 ENCOUNTER — Telehealth: Payer: Self-pay | Admitting: Internal Medicine

## 2011-11-15 NOTE — Telephone Encounter (Signed)
Letter dictated

## 2011-11-15 NOTE — Telephone Encounter (Signed)
Spoke with pt. He states that he is needing letter to his insurance company explaining his OSA and that he is treated with CPAP "but dx is not life threatening". He states that once letter is done wants this mailed to him. He states that this is something that was d/w CDY at his mother's last ov b/c he was here with her. Please advise, thanks!

## 2011-11-16 NOTE — Letter (Signed)
November 15, 2011   Cyndi Bender  RE:  CLEMENTE, Tyler Perkins:  [id number]  /  DOB:  [birth year]  To Whom It May Concern:  Reference CPAP therapy for sleep apnea.  Mr. Jarriel is a long-term patient under my care for obstructive sleep apnea.  He is well controlled and very compliant with CPAP therapy.  This diagnosis is not life-threatening.   Sincerely,     Deonta Bomberger D. Maple Hudson, MD, FCCP, FACP   CDY/MedQ  DD: 11/15/2011  DT: 11/16/2011  Job #: [id number]

## 2011-11-18 NOTE — Telephone Encounter (Signed)
Pt is aware that CY has dictated the letter and that we are sending the letter out in the mail today.

## 2011-11-18 NOTE — Telephone Encounter (Signed)
Tyler Addison do you have the letter. Please advise thanks

## 2012-03-02 ENCOUNTER — Encounter: Payer: Self-pay | Admitting: Internal Medicine

## 2012-03-02 ENCOUNTER — Ambulatory Visit (INDEPENDENT_AMBULATORY_CARE_PROVIDER_SITE_OTHER): Payer: Medicare Other | Admitting: Internal Medicine

## 2012-03-02 ENCOUNTER — Ambulatory Visit (INDEPENDENT_AMBULATORY_CARE_PROVIDER_SITE_OTHER)
Admission: RE | Admit: 2012-03-02 | Discharge: 2012-03-02 | Disposition: A | Discharge status: Home / Self Care | Payer: Medicare Other | Source: Ambulatory Visit | Attending: Internal Medicine | Admitting: Internal Medicine

## 2012-03-02 VITALS — BP 102/62 | HR 84 | Ht 70.0 in | Wt 197.2 lb

## 2012-03-02 DIAGNOSIS — J984 Other disorders of lung: Secondary | ICD-10-CM

## 2012-03-02 DIAGNOSIS — J449 Chronic obstructive pulmonary disease, unspecified: Secondary | ICD-10-CM

## 2012-03-02 DIAGNOSIS — J4489 Other specified chronic obstructive pulmonary disease: Secondary | ICD-10-CM

## 2012-03-02 DIAGNOSIS — G4733 Obstructive sleep apnea (adult) (pediatric): Secondary | ICD-10-CM

## 2012-03-02 DIAGNOSIS — Z87891 Personal history of nicotine dependence: Secondary | ICD-10-CM

## 2012-03-02 MED ORDER — CLONAZEPAM 0.5 MG PO TABS: ORAL_TABLET | ORAL | Status: DC

## 2012-03-02 NOTE — Progress Notes (Signed)
Subjective:    Patient ID: Tyler Perkins, male    DOB: 1941/02/12, 71 y.o.   MRN: [id number]  HPI 02/25/11- 43 yoM former smoker (45 pack years) followed for COPD, hx lung nodule, OSA,complicated by chronic back pain.     Wife here    PCP Dr Sherril Croon Last here February 23, 2010 He has been concerned about exertional dyspnea. Quit smoking 3 years ago. Seen in June for cardiology assessment by Dr Diona Browner because of exertional dyspnea. Told stress test didn't show basis to justify cath, and that he should walk more aggressively. He is trying to do that, and says exercise tolerance is getting a little better. Scheduled for ablation procedure for his back pain. Has lost 10 lbs. CPAP 14- long term autoPAP 8-20. Able to use it all night every night. Prevents snore.   03/02/12-69 yoM former smoker (45 pack years) followed for COPD, hx lung nodule, OSA,complicated by chronic back pain.   Pt state wearing CPAP nightly approx 6 hrs. Pt states that he has a habit with pulling it off in the middle of the night. Pt denies any issue with mask or pressure.    Wife here CPAP autoPAP/ [employer]. He still pulls off mask occasionally in his sleep, usually in the last hour or so before waking. Has had injections for back pain which may give systemic steroid effect helpful to his breathing. CXR 02/26/11- IMPRESSION:  Stable basilar scarring. No active lung disease.  Original Report Authenticated By: Juline Patch, M.D.   Review of Systems ROS-see HPI Constitutional:   No-   weight loss, night sweats, fevers, chills, fatigue, lassitude. HEENT:   No-  headaches, difficulty swallowing, tooth/dental problems, sore throat,       No-  sneezing, itching, ear ache, nasal congestion, post nasal drip,  CV:  No-   chest pain, orthopnea, PND, swelling in lower extremities, anasarca, dizziness, palpitations Resp: No- acute  shortness of breath with exertion or at rest.              No-   productive cough,  No non-productive  cough,  No- coughing up of blood.              No-   change in color of mucus.  No- wheezing.   Skin: No-   rash or lesions. GI:  No-   heartburn, indigestion, abdominal pain, nausea, vomiting, GU:  MS:  No-   joint pain or swelling.  + Chronic back pain Neuro-     nothing unusual Psych:  No- change in mood or affect. No depression or anxiety.  No memory loss.    Objective:   Physical Exam General- Alert, Oriented, Affect-appropriate, Distress- none acute  Medium build, very relaxed Skin- rash-none, lesions- none, excoriation- none Lymphadenopathy- none Head- atraumatic            Eyes- Gross vision intact, PERRLA, conjunctivae clear secretions            Ears- Hearing, canals            Nose- Clear, no-Septal dev, mucus, polyps, erosion, perforation             Throat- Mallampati IV , mucosa clear , drainage- none, tonsils- atrophic,+ dentures Neck- flexible , trachea midline, no stridor , thyroid nl, carotid no bruit Chest - symmetrical excursion , unlabored           Heart/CV- RRR , no murmur , no gallop  , no rub, nl  s1 s2                           - JVD- none , edema- none, stasis changes- none, varices- none           Lung- clear to P&A, wheeze- none, cough- none , dullness-none, rub- none           Chest wall-  Abd-  Br/ Gen/ Rectal- Not done, not indicated Extrem- cyanosis- none, clubbing, none, atrophy- none, strength- nl Neuro- grossly intact to observation   Assessment & Plan:

## 2012-03-02 NOTE — Patient Instructions (Addendum)
Script to try clonazepam instead of temazepam. Call for refill as needed.  Order- CXR- COPD, lung nodule

## 2012-03-04 NOTE — Progress Notes (Signed)
Quick Note:  Pt aware of results. ______ 

## 2012-03-07 NOTE — Assessment & Plan Note (Signed)
CPAP is successful except that he pulls off late in the night. We discussed comfort issues. Plan-try clonazepam for sleep consolidation and maintenance

## 2012-03-07 NOTE — Assessment & Plan Note (Signed)
He and his wife assure me that he remains off of cigarettes.

## 2012-03-07 NOTE — Assessment & Plan Note (Signed)
Plan-he is due for followup chest x-ray.

## 2012-03-16 ENCOUNTER — Telehealth: Payer: Self-pay | Admitting: Internal Medicine

## 2012-03-16 MED ORDER — TEMAZEPAM 30 MG PO CAPS: 30.0000 mg | ORAL_CAPSULE | Freq: Every evening | ORAL | Status: DC | PRN

## 2012-03-16 NOTE — Telephone Encounter (Signed)
Per CY-clonazepam may work better if take 30-45 minutes before bedtime, if he has any left to try. Ok to Rx temazepam 30 mg #30 1 qhs prn sleep with 5 refills.

## 2012-03-16 NOTE — Telephone Encounter (Signed)
Pt states he has tried taking clonazepam 30-45 minutes prior to sleep already and wants Rx called to [facility]. Pt aware Temazepam has been called.

## 2012-03-16 NOTE — Telephone Encounter (Signed)
Called spoke with patient who stated that he tried the clonazepam x15days as prescribed by CY.  Pt stated that it takes him longer to fall asleep with the clonazepam and he also still wakes up very early in the morning.  Stated with the temazepam he was at least falling asleep quickly and then waking up early.  Would like to switch back to the temazepam.  Dr Maple Hudson please advise, thanks.

## 2012-03-30 ENCOUNTER — Ambulatory Visit (INDEPENDENT_AMBULATORY_CARE_PROVIDER_SITE_OTHER): Payer: Medicare Other | Admitting: Internal Medicine

## 2012-04-08 ENCOUNTER — Encounter (INDEPENDENT_AMBULATORY_CARE_PROVIDER_SITE_OTHER): Payer: Self-pay | Admitting: Internal Medicine

## 2012-04-08 ENCOUNTER — Ambulatory Visit (INDEPENDENT_AMBULATORY_CARE_PROVIDER_SITE_OTHER): Payer: Medicare Other | Admitting: Internal Medicine

## 2012-04-08 VITALS — BP 104/58 | HR 80 | Temp 98.9°F | Ht 70.0 in | Wt 195.6 lb

## 2012-04-08 DIAGNOSIS — K219 Gastro-esophageal reflux disease without esophagitis: Secondary | ICD-10-CM | POA: Insufficient documentation

## 2012-04-08 NOTE — Patient Instructions (Addendum)
OV in 1 year. Continue Omeprazole and baking soda. Any problems call our office.

## 2012-04-08 NOTE — Progress Notes (Signed)
Subjective:     Patient ID: Tyler Perkins, male   DOB: 12-29-1940, 71 y.o.   MRN: [id number]  HPIEddie is here for a scheduled visit. He has a hx of acid reflux. He tells me he is doing fine. He has a hx of GERD. He was on Nexium but was not able to afford it.  He takes Omeprazole with a 1/2 tsp baking soda which helps with his acid reflux. He is having very little acid reflux at this time. Appetite is good. No weight loss.  He denies any abdominal pain. He usually has a BM x 1 a day or every other day.  He presently not taking the Metamucil.  No melena or bright red rectal bleeding. No anal itching.  Patient complains of diarrhea and perianal itching.     February 2012 and he had surveillance colonoscopy. He had a small tubular adenoma removed. He also had external hemorrhoids    Review of Systems see hpi Current Outpatient Prescriptions  Medication Sig Dispense Refill  . amitriptyline (ELAVIL) 75 MG tablet Take 50 mg by mouth at bedtime.       . Artificial Tear (GENTEAL) GEL Apply to eye. Apply to right eye at night      . Artificial Tear Solution (TEARS NATURALE II) SOLN Place 1 drop into both eyes 3 (three) times daily.       Marland Kitchen aspirin (BAYER LOW STRENGTH) 81 MG EC tablet Take 81 mg by mouth daily.        . beta carotene w/minerals (OCUVITE) tablet Take 1 tablet by mouth daily.      . fenofibrate 160 MG tablet Take 160 mg by mouth daily.      . Multiple Vitamin (MULTIVITAMIN) tablet Take 1 tablet by mouth daily.        Marland Kitchen omeprazole (PRILOSEC) 20 MG capsule Take 20 mg by mouth 2 (two) times daily. Pt takes 1/2 tsp baking soda with each dose daily.      Marland Kitchen PARoxetine (PAXIL) 20 MG tablet Take 40 mg by mouth daily.       . simvastatin (ZOCOR) 80 MG tablet Take 80 mg by mouth at bedtime.      . sodium chloride (OCEAN) 0.65 % nasal spray Place 2 sprays into the nose 6 (six) times daily.        . temazepam (RESTORIL) 30 MG capsule Take 1 capsule (30 mg total) by mouth at bedtime as needed.  30  capsule  5  . timolol (TIMOPTIC) 0.5 % ophthalmic solution Place 1 drop into both eyes Daily.      . clonazePAM (KLONOPIN) 0.5 MG tablet 1 or 2 tabs, 1/2 hour before bedtime as needed for sleep  30 tablet  0  . psyllium (METAMUCIL SMOOTH TEXTURE) 28 % packet Take 1 packet by mouth 2 (two) times daily.  30 packet  11   Past Medical History  Diagnosis Date  . Lung nodule   . COPD (chronic obstructive pulmonary disease)   . Carotid arterial disease   . Obstructive sleep apnea   . GERD (gastroesophageal reflux disease)   . Mixed hyperlipidemia   . Macular degeneration     both eyes   Past Surgical History  Procedure Date  . Cataract extraction   . Lymph node dissection 2007    VATS resection intraparenchymal lymph node  . Deviated septum repair   . Laparoscopic appendectomy   . Retinal detachment repair w/ scleral buckle le    History  Social History  . Marital Status: Married    Spouse Name: N/A    Number of Children: N/A  . Years of Education: N/A   Occupational History  . Not on file.   Social History Main Topics  . Smoking status: Former Smoker -- 1.5 packs/day for 20 years    Types: Cigarettes    Quit date: 11/14/2007  . Smokeless tobacco: Never Used  . Alcohol Use: No  . Drug Use: No  . Sexually Active: Not on file   Other Topics Concern  . Not on file   Social History Narrative  . No narrative on file   Family Status  Relation Status Death Age  . Father Deceased 68  . Mother Alive   . Brother Alive   . Daughter Alive   . Son Alive    Allergies  Allergen Reactions  . Codeine   . Erythromycin   . Hydromorphone Hcl     *Dilaudid*  . Morphine   . Lyrica (Pregabalin) Swelling        Objective:   Physical Exam Filed Vitals:   04/08/12 1423  Height: 5\' 10"  (1.778 m)  Weight: 195 lb 9.6 oz (88.724 kg)   Filed Vitals:   04/08/12 1423  BP: 104/58  Pulse: 80  Temp: 98.9 F (37.2 C)   Alert and oriented. Skin warm and dry. Oral mucosa is  moist.   . Sclera anicteric, conjunctivae is pink. Thyroid not enlarged. No cervical lymphadenopathy. Lungs clear. Heart regular rate and rhythm.  Abdomen is soft. Bowel sounds are positive. No hepatomegaly. No abdominal masses felt. No tenderness.  No edema to lower extremities. Patient is alert and oriented.      Assessment:    GERD controlled at this time with Omeprazole and baking soda.     Plan:    Continue Omeprazole. OV in one year unless you have problems.

## 2012-07-13 ENCOUNTER — Encounter: Payer: Self-pay | Admitting: Cardiology

## 2012-08-13 ENCOUNTER — Encounter: Payer: Self-pay | Admitting: Cardiology

## 2012-08-13 ENCOUNTER — Ambulatory Visit (INDEPENDENT_AMBULATORY_CARE_PROVIDER_SITE_OTHER): Payer: Medicare Other | Admitting: Cardiology

## 2012-08-13 ENCOUNTER — Other Ambulatory Visit: Payer: Self-pay | Admitting: *Deleted

## 2012-08-13 ENCOUNTER — Encounter: Payer: Self-pay | Admitting: *Deleted

## 2012-08-13 VITALS — BP 137/79 | HR 84 | Ht 70.0 in | Wt 197.0 lb

## 2012-08-13 DIAGNOSIS — Z136 Encounter for screening for cardiovascular disorders: Secondary | ICD-10-CM

## 2012-08-13 DIAGNOSIS — R0602 Shortness of breath: Secondary | ICD-10-CM

## 2012-08-13 DIAGNOSIS — J4489 Other specified chronic obstructive pulmonary disease: Secondary | ICD-10-CM

## 2012-08-13 DIAGNOSIS — R0609 Other forms of dyspnea: Secondary | ICD-10-CM

## 2012-08-13 DIAGNOSIS — I779 Disorder of arteries and arterioles, unspecified: Secondary | ICD-10-CM

## 2012-08-13 DIAGNOSIS — J449 Chronic obstructive pulmonary disease, unspecified: Secondary | ICD-10-CM

## 2012-08-13 DIAGNOSIS — E7800 Pure hypercholesterolemia, unspecified: Secondary | ICD-10-CM

## 2012-08-13 NOTE — Assessment & Plan Note (Signed)
On statin therapy, followed by Dr. Vyas. 

## 2012-08-13 NOTE — Progress Notes (Signed)
Clinical Summary Tyler Perkins is a 72 y.o.male referred back to the office by Dr. Sherril Perkins due to progressive shortness of breath. He was last seen in July 2012. He had undergone an outpatient exercise Cardiolite around that time that showed no diagnostic ST segment changes, inferior attenuation without obvious ischemia, and LVEF of 58%. He does have history of COPD with previous lung nodule, also OSA on CPAP, followed by Dr. Maple Perkins. He was seen in July of last year at that time was relatively stable.  He states that over the ensuing months he has been more short of breath with activity, also an increased dry cough. He is describing NYHA class III dyspnea at this time, no specific exertional chest pain. He reports no major change in his medical regimen, did not respond to a steroid inhaler prescribed him by Dr. Sherril Perkins. Chest x-ray from July 2013 reported stable basilar scarring with no acute changes.  ECG today shows sinus rhythm with prolonged PR interval, no acute ST segment changes.   Allergies  Allergen Reactions  . Codeine   . Erythromycin   . Hydromorphone Hcl     *Dilaudid*  . Morphine   . Lyrica (Pregabalin) Swelling    Current Outpatient Prescriptions  Medication Sig Dispense Refill  . amitriptyline (ELAVIL) 75 MG tablet Take 50 mg by mouth at bedtime.       . Artificial Tear (GENTEAL) GEL Apply to eye. Apply to right eye at night      . Artificial Tear Solution (TEARS NATURALE II) SOLN Place 1 drop into both eyes 3 (three) times daily.       Marland Kitchen aspirin (BAYER LOW STRENGTH) 81 MG EC tablet Take 81 mg by mouth daily.        . beta carotene w/minerals (OCUVITE) tablet Take 1 tablet by mouth daily.      . fenofibrate 160 MG tablet Take 160 mg by mouth daily.      . Multiple Vitamin (MULTIVITAMIN) tablet Take 1 tablet by mouth daily.        . NON FORMULARY CPAP Use as directed      . omeprazole (PRILOSEC) 20 MG capsule Take 20 mg by mouth 2 (two) times daily. Pt takes 1/2 tsp baking soda  with each dose daily.      Marland Kitchen PARoxetine (PAXIL) 20 MG tablet Take 40 mg by mouth daily.       . simvastatin (ZOCOR) 80 MG tablet Take 80 mg by mouth at bedtime.      . sodium chloride (OCEAN) 0.65 % nasal spray Place 2 sprays into the nose 6 (six) times daily.        . temazepam (RESTORIL) 30 MG capsule Take 1 capsule (30 mg total) by mouth at bedtime as needed.  30 capsule  5  . timolol (TIMOPTIC) 0.5 % ophthalmic solution Place 1 drop into both eyes Daily.        Past Medical History  Diagnosis Date  . Lung nodule   . COPD (chronic obstructive pulmonary disease)   . Carotid arterial disease   . Obstructive sleep apnea   . GERD (gastroesophageal reflux disease)   . Mixed hyperlipidemia   . Macular degeneration     both eyes    Past Surgical History  Procedure Date  . Cataract extraction   . Lymph node dissection 2007    VATS resection intraparenchymal lymph node  . Deviated septum repair   . Laparoscopic appendectomy   . Retinal detachment repair w/  scleral buckle le     Family History  Problem Relation Age of Onset  . Pneumonia Tyler Perkins   . Heart failure Tyler Perkins   . Other Tyler Perkins     Vocal cord paralysis, sleeps with BIPAP  . Heart disease Tyler Perkins   . Coronary artery disease Tyler Perkins     CABG on his 34s  . Heart disease Tyler Perkins   . Diabetes Tyler Perkins   . Healthy Tyler Perkins     Social History Tyler Perkins reports that he quit smoking about 4 years ago. His smoking use included Cigarettes. He has a 30 pack-year smoking history. He has never used smokeless tobacco. Tyler Perkins reports that he does not drink alcohol.  Review of Systems No palpitations or syncope. Wears hearing aids. No orthopnea or PND. No pitting edema. Stable appetite. Otherwise negative.  Physical Examination Filed Vitals:   08/13/12 1302  BP: 137/79  Pulse: 84   Filed Weights   08/13/12 1302  Weight: 197 lb (89.359 kg)   Overweight male in no acute distress.  HEENT: Conjunctiva and lids normal, oropharynx  with moist mucosa.  Neck: Supple, no elevated JVP or significant carotid bruit.  Lungs: Diminished but clear breath sounds, nonlabored.  Cardiac: Regular rate and rhythm, no significant murmur or gallop.  Abdomen: Soft, nontender, bowel sounds present.  Skin: No rashes or ulcerations.  Extremities: No pitting edema, distal pulses full.  Musculoskeletal: No kyphosis.  Neuropsychiatric: Alert and oriented x3, affect grossly appropriate.   Problem List and Plan   Dyspnea on exertion Progressive over the last few months as outlined, NYHA class III. He had a recent echocardiogram done at [facility] internal medicine showing normal LV function with mild diastolic dysfunction, no major valvular abnormalities. He does have a significant family history of premature CAD, remains concerned about the status of his heart with stable lung findings as of 6 months ago. We discussed both noninvasive and invasive methods of evaluation, and at this point he is most comfortable with stress testing. We will arrange an exercise Cardiolite and proceed from there. Office followup arranged.  COPD, MILD Followed by Dr. Maple Perkins and stable as of July 2013. Chest x-ray showed no acute findings at that time.  HYPERCHOLESTEROLEMIA On statin therapy, followed by Dr. Sherril Perkins.  Carotid artery disease Possible history, although carotid Doppler study at [facility] internal medicine in 2010 did not demonstrate any hemodynamically significant stenoses.    Tyler Perkins, M.D., F.A.C.C.

## 2012-08-13 NOTE — Assessment & Plan Note (Signed)
Followed by Dr. Maple Hudson and stable as of July 2013. Chest x-ray showed no acute findings at that time.

## 2012-08-13 NOTE — Patient Instructions (Addendum)
Your physician recommends that you schedule a follow-up appointment in: 2 weeks. Your physician recommends that you continue on your current medications as directed. Please refer to the Current Medication list given to you today.  Your physician has requested that you have en exercise stress myoview. For further information please visit [email or web address] Please follow instruction sheet, as given.

## 2012-08-13 NOTE — Assessment & Plan Note (Signed)
Progressive over the last few months as outlined, NYHA class III. He had a recent echocardiogram done at [facility] internal medicine showing normal LV function with mild diastolic dysfunction, no major valvular abnormalities. He does have a significant family history of premature CAD, remains concerned about the status of his heart with stable lung findings as of 6 months ago. We discussed both noninvasive and invasive methods of evaluation, and at this point he is most comfortable with stress testing. We will arrange an exercise Cardiolite and proceed from there. Office followup arranged.

## 2012-08-13 NOTE — Assessment & Plan Note (Signed)
Possible history, although carotid Doppler study at [facility] internal medicine in 2010 did not demonstrate any hemodynamically significant stenoses.

## 2012-08-19 DIAGNOSIS — R0602 Shortness of breath: Secondary | ICD-10-CM

## 2012-08-21 ENCOUNTER — Telehealth: Payer: Self-pay | Admitting: *Deleted

## 2012-08-21 NOTE — Telephone Encounter (Signed)
Message copied by Eustace Moore on Fri Aug 21, 2012  3:44 PM ------      Message from: Jonelle Sidle      Created: Fri Aug 21, 2012  9:02 AM       Reviewed report. Study shows no definite ischemia, somewhat reduced exercise tolerance. Plan to discuss further with him in the office regarding symptoms and whether he wants to pursue any further cardiac evaluation

## 2012-08-21 NOTE — Telephone Encounter (Signed)
Patient informed. 

## 2012-09-02 ENCOUNTER — Ambulatory Visit: Payer: Medicare Other | Admitting: Cardiology

## 2012-09-08 ENCOUNTER — Ambulatory Visit (INDEPENDENT_AMBULATORY_CARE_PROVIDER_SITE_OTHER): Payer: Medicare Other | Admitting: Cardiology

## 2012-09-08 ENCOUNTER — Encounter: Payer: Self-pay | Admitting: Cardiology

## 2012-09-08 VITALS — BP 109/73 | HR 93 | Ht 70.0 in | Wt 195.1 lb

## 2012-09-08 DIAGNOSIS — E7800 Pure hypercholesterolemia, unspecified: Secondary | ICD-10-CM

## 2012-09-08 DIAGNOSIS — R0989 Other specified symptoms and signs involving the circulatory and respiratory systems: Secondary | ICD-10-CM

## 2012-09-08 NOTE — Progress Notes (Signed)
Clinical Summary Tyler Perkins is a 72 y.o.male presenting for followup. He was seen in the office on January 9 for further evaluation of dyspnea on exertion. He had undergone an echocardiogram at [facility] medicine demonstrating normal LV systolic function with mild diastolic dysfunction, no major valvular abnormalities. Followup ischemic testing was arranged.  Exercise Cardiolite demonstrated below average exercise tolerance but no ischemic changes by ECG or perfusion imaging, LVEF 65%. He achieved adequate heart rate response, 7 METS in stage 2. Review the results today. He states that since his last visit, he has tried to go back to doing some regular walking. He goes to the mall and walks indoors. States that he has tolerated this without being overly short of breath. No exertional chest heaviness.  We discussed his cardiac risk factors, possibility of some degree of underlying coronary atherosclerosis, although his Myoview not indicating any definite severe obstructive disease. For now plan to continue diet and exercise, gradually increasing his capacity, and continue medical therapy which includes aspirin and statin.   Allergies  Allergen Reactions  . Codeine   . Erythromycin   . Hydromorphone Hcl     *Dilaudid*  . Morphine   . Lyrica (Pregabalin) Swelling    Current Outpatient Prescriptions  Medication Sig Dispense Refill  . amitriptyline (ELAVIL) 50 MG tablet Take 50 mg by mouth at bedtime.      . Artificial Tear (GENTEAL) GEL Apply to eye. Apply to right eye at night      . aspirin (BAYER LOW STRENGTH) 81 MG EC tablet Take 81 mg by mouth daily.        . beta carotene w/minerals (OCUVITE) tablet Take 1 tablet by mouth daily.      . fenofibrate 160 MG tablet Take 160 mg by mouth daily.      . Multiple Vitamin (MULTIVITAMIN) tablet Take 1 tablet by mouth daily.        . NON FORMULARY CPAP Use as directed      . omeprazole (PRILOSEC) 20 MG capsule Take 20 mg by mouth 2 (two) times daily.  Pt takes 1/2 tsp baking soda with each dose daily.      Marland Kitchen OVER THE COUNTER MEDICATION LUTEIN CREAM - 1/4" cream applied to lower eyelid every night      . PARoxetine (PAXIL) 20 MG tablet Take 40 mg by mouth daily.       Marland Kitchen Propylene Glycol (SYSTANE BALANCE) 0.6 % SOLN Apply 1 drop to eye 3 (three) times daily.      . simvastatin (ZOCOR) 80 MG tablet Take 80 mg by mouth at bedtime.      . sodium chloride (OCEAN) 0.65 % nasal spray Place 2 sprays into the nose 6 (six) times daily.        . temazepam (RESTORIL) 30 MG capsule Take 1 capsule (30 mg total) by mouth at bedtime as needed.  30 capsule  5  . timolol (TIMOPTIC) 0.5 % ophthalmic solution Place 1 drop into both eyes Daily.        Past Medical History  Diagnosis Date  . Lung nodule   . COPD (chronic obstructive pulmonary disease)   . Carotid arterial disease   . Obstructive sleep apnea   . GERD (gastroesophageal reflux disease)   . Mixed hyperlipidemia   . Macular degeneration     both eyes    Social History Tyler Perkins reports that he quit smoking about 4 years ago. His smoking use included Cigarettes. He has a 30  pack-year smoking history. He has never used smokeless tobacco. Tyler Perkins reports that he does not drink alcohol.  Review of Systems Negative except as outlined.  Physical Examination Filed Vitals:   09/08/12 1301  BP: 109/73  Pulse: 93   Filed Weights   09/08/12 1301  Weight: 195 lb 1.9 oz (88.506 kg)    Overweight male in no acute distress.  HEENT: Conjunctiva and lids normal, oropharynx with moist mucosa.  Neck: Supple, no elevated JVP or significant carotid bruit.  Lungs: Diminished but clear breath sounds, nonlabored.  Cardiac: Regular rate and rhythm, no significant murmur or gallop.  Abdomen: Soft, nontender, bowel sounds present.  Extremities: No pitting edema, distal pulses full.    Problem List and Plan   Dyspnea on exertion For now will continue plan of diet and regular exercise, observing for  any change in symptoms. He is on aspirin and statin as well. Myoview did not demonstrate any obvious evidence of severe obstructive CAD, normal LV function. If symptoms worsen despite these measures, may need to pursue further evaluation, possibly cardiac catheterization. Followup arranged.  HYPERCHOLESTEROLEMIA Continues on high-dose simvastatin, followed by Dr. Sherril Croon.    Jonelle Sidle, M.D., F.A.C.C.

## 2012-09-08 NOTE — Assessment & Plan Note (Signed)
Continues on high-dose simvastatin, followed by Dr. Sherril Croon.

## 2012-09-08 NOTE — Patient Instructions (Addendum)
Your physician recommends that you schedule a follow-up appointment in: 3 months. Your physician recommends that you continue on your current medications as directed. Please refer to the Current Medication list given to you today. 

## 2012-09-08 NOTE — Assessment & Plan Note (Signed)
For now will continue plan of diet and regular exercise, observing for any change in symptoms. He is on aspirin and statin as well. Myoview did not demonstrate any obvious evidence of severe obstructive CAD, normal LV function. If symptoms worsen despite these measures, may need to pursue further evaluation, possibly cardiac catheterization. Followup arranged.

## 2012-12-14 ENCOUNTER — Ambulatory Visit: Payer: Medicare Other | Admitting: Cardiology

## 2012-12-18 ENCOUNTER — Ambulatory Visit (INDEPENDENT_AMBULATORY_CARE_PROVIDER_SITE_OTHER): Payer: Medicare Other | Admitting: Cardiology

## 2012-12-18 ENCOUNTER — Encounter: Payer: Self-pay | Admitting: Cardiology

## 2012-12-18 VITALS — BP 115/79 | HR 98 | Ht 70.0 in | Wt 193.0 lb

## 2012-12-18 DIAGNOSIS — R0609 Other forms of dyspnea: Secondary | ICD-10-CM

## 2012-12-18 DIAGNOSIS — E7800 Pure hypercholesterolemia, unspecified: Secondary | ICD-10-CM

## 2012-12-18 DIAGNOSIS — R0989 Other specified symptoms and signs involving the circulatory and respiratory systems: Secondary | ICD-10-CM

## 2012-12-18 NOTE — Assessment & Plan Note (Signed)
No progression. Recent ischemic testing reviewed. Plan is to continue medical therapy and observation, move back to a regular walking regimen. If he manifests progressive symptoms despite these interventions, we can always discuss a cardiac catheterization. He voiced comfort with this.

## 2012-12-18 NOTE — Patient Instructions (Signed)
Continue all current medications. Your physician wants you to follow up in: 6 months.  You will receive a reminder letter in the mail one-two months in advance.  If you don't receive a letter, please call our office to schedule the follow up appointment   

## 2012-12-18 NOTE — Progress Notes (Signed)
Clinical Summary [name] is a 72 y.o.male last seen in February of this year.  Exercise Cardiolite earlier this year demonstrated below average exercise tolerance but no ischemic changes by ECG or perfusion imaging, LVEF 65%. He achieved adequate heart rate response, 7 METS in stage 2. We have reviewed this, and he reports no progression in baseline dyspnea on exertion, no exertional chest pain.  Strategy has been risk factor modification. He did have recent followup lab work showing LDL 110, HDL 41, triglycerides down to 170. He reports compliance with his medications.  He has not been walking for exercise as he once was. We discussed this today.  Also we reviewed warning signs, and consideration for a cardiac catheterization if he has any progressive symptoms.   Allergies  Allergen Reactions  . Codeine   . Erythromycin   . Hydromorphone Hcl     *Dilaudid*  . Ketorolac     persistent headaches  . Morphine   . Ultram (Tramadol)     confusion  . Lyrica (Pregabalin) Swelling    Current Outpatient Prescriptions  Medication Sig Dispense Refill  . amitriptyline (ELAVIL) 50 MG tablet Take 50 mg by mouth at bedtime.      Marland Kitchen aspirin (BAYER LOW STRENGTH) 81 MG EC tablet Take 81 mg by mouth daily.        . beta carotene w/minerals (OCUVITE) tablet Take 1 tablet by mouth daily.      . brimonidine (ALPHAGAN) 0.2 % ophthalmic solution Place 1 drop into the right eye 2 (two) times daily.      . fenofibrate 160 MG tablet Take 160 mg by mouth daily.      Marland Kitchen latanoprost (XALATAN) 0.005 % ophthalmic solution Place 1 drop into the left eye at bedtime.      . Multiple Vitamin (MULTIVITAMIN) tablet Take 1 tablet by mouth daily.        . NON FORMULARY CPAP Use as directed      . omeprazole (PRILOSEC) 20 MG capsule Take 20 mg by mouth 2 (two) times daily. Pt takes 1/2 tsp baking soda with each dose daily.      Marland Kitchen PARoxetine (PAXIL) 20 MG tablet Take 40 mg by mouth daily.       Marland Kitchen Propylene Glycol  (SYSTANE BALANCE) 0.6 % SOLN Apply 1 drop to eye as needed.       . simvastatin (ZOCOR) 80 MG tablet Take 80 mg by mouth at bedtime.      . sodium chloride (OCEAN) 0.65 % nasal spray Place 2 sprays into the nose 3 (three) times daily.       . temazepam (RESTORIL) 30 MG capsule Take 1 capsule (30 mg total) by mouth at bedtime as needed.  30 capsule  5   No current facility-administered medications for this visit.    Past Medical History  Diagnosis Date  . Lung nodule   . COPD (chronic obstructive pulmonary disease)   . Carotid arterial disease   . Obstructive sleep apnea   . GERD (gastroesophageal reflux disease)   . Mixed hyperlipidemia   . Macular degeneration     Bilateral    Social History [name] reports that he quit smoking about 5 years ago. His smoking use included Cigarettes. He has a 30 pack-year smoking history. He has never used smokeless tobacco. [name] reports that he does not drink alcohol.  Review of Systems No palpitations, orthopnea, PND. No claudication.  Physical Examination Filed Vitals:   12/18/12 1055  BP: 115/79  Pulse: 98   Filed Weights   12/18/12 1055  Weight: 193 lb (87.544 kg)    Overweight male in no acute distress.  HEENT: Conjunctiva and lids normal, oropharynx with moist mucosa.  Neck: Supple, no elevated JVP or significant carotid bruit.  Lungs: Diminished but clear breath sounds, nonlabored.  Cardiac: Regular rate and rhythm, no significant murmur or gallop.  Abdomen: Soft, nontender, bowel sounds present.  Extremities: No pitting edema, distal pulses full.    Problem List and Plan   Dyspnea on exertion No progression. Recent ischemic testing reviewed. Plan is to continue medical therapy and observation, move back to a regular walking regimen. If he manifests progressive symptoms despite these interventions, we can always discuss a cardiac catheterization. He voiced comfort with this.  HYPERCHOLESTEROLEMIA Continue current  regimen, followed by Dr. Sherril Perkins.    Tyler Perkins, M.D., F.A.C.C.

## 2012-12-18 NOTE — Assessment & Plan Note (Signed)
Continue current regimen, followed by Dr. Sherril Croon.

## 2013-01-04 ENCOUNTER — Telehealth (INDEPENDENT_AMBULATORY_CARE_PROVIDER_SITE_OTHER): Payer: Self-pay | Admitting: *Deleted

## 2013-01-04 NOTE — Telephone Encounter (Signed)
Dr.Rehman made aware and he will address this on 01/05/13.

## 2013-01-04 NOTE — Telephone Encounter (Signed)
Would like for Tammy to return his call at [phone]. He would like to know if Dr. Karilyn Cota would please put him back on Nexium 40 mg capsules twice daily. His insurance has changed again. Orel said the reason he stopped the first time was because his insurance at that time would not pay.

## 2013-01-06 NOTE — Telephone Encounter (Signed)
Per Dr.Rehman may call in Nexium 40 mg Take 1 by mouth twice a day, 1 Month- 11 refills This was called to [facility] in [place] [employer] is aware

## 2013-01-21 ENCOUNTER — Encounter: Payer: Self-pay | Admitting: Cardiology

## 2013-03-05 ENCOUNTER — Ambulatory Visit (INDEPENDENT_AMBULATORY_CARE_PROVIDER_SITE_OTHER): Payer: Medicare Other | Admitting: Internal Medicine

## 2013-03-05 ENCOUNTER — Encounter: Payer: Self-pay | Admitting: Internal Medicine

## 2013-03-05 VITALS — BP 142/66 | HR 119 | Ht 69.5 in | Wt 181.8 lb

## 2013-03-05 DIAGNOSIS — J449 Chronic obstructive pulmonary disease, unspecified: Secondary | ICD-10-CM

## 2013-03-05 DIAGNOSIS — G4733 Obstructive sleep apnea (adult) (pediatric): Secondary | ICD-10-CM

## 2013-03-05 DIAGNOSIS — R0609 Other forms of dyspnea: Secondary | ICD-10-CM

## 2013-03-05 DIAGNOSIS — R0600 Dyspnea, unspecified: Secondary | ICD-10-CM

## 2013-03-05 NOTE — Patient Instructions (Addendum)
Order- schedule PFT, 6 MWT  Dx COPD, dyspnea

## 2013-03-05 NOTE — Progress Notes (Signed)
Subjective:    Patient ID: Tyler Perkins, male    DOB: 03/03/1941, 72 y.o.   MRN: [id number]  HPI 02/25/11- 31 yoM former smoker (45 pack years) followed for COPD, hx lung nodule, OSA,complicated by chronic back pain.     Wife here    PCP Dr Sherril Croon Last here February 23, 2010 He has been concerned about exertional dyspnea. Quit smoking 3 years ago. Seen in June for cardiology assessment by Dr Diona Browner because of exertional dyspnea. Told stress test didn't show basis to justify cath, and that he should walk more aggressively. He is trying to do that, and says exercise tolerance is getting a little better. Scheduled for ablation procedure for his back pain. Has lost 10 lbs. CPAP 14- long term autoPAP 8-20. Able to use it all night every night. Prevents snore.   03/02/12-72 yoM former smoker (45 pack years) followed for COPD, hx lung nodule, OSA,complicated by chronic back pain.   Pt state wearing CPAP nightly approx 6 hrs. Pt states that he has a habit with pulling it off in the middle of the night. Pt denies any issue with mask or pressure.    Wife here CPAP autoPAP/ [employer]. He still pulls off mask occasionally in his sleep, usually in the last hour or so before waking. Has had injections for back pain which may give systemic steroid effect helpful to his breathing. CXR 02/26/11- IMPRESSION:  Stable basilar scarring. No active lung disease.  Original Report Authenticated By: Juline Patch, M.D.   03/05/13-72 yoM former smoker (45 pack years) followed for COPD, hx lung nodule, OSA,complicated by chronic back pain. FOLLOWS FOR:Pt reports he is wearing CPAP/ Auto/ [facility] everynight approx 5-6 hrs a night. He wakes up 3-4 AM and finds the mask off his face. he is not sure why he is taking off while sleeping.  He reports he is still SOB w/ activity, especially if he lifts, pushes or uses his arms..  Had stress test and good report from cardiology. Dry cough since April. Treats GERD  with Nexi CXR 03/04/13  IMPRESSION:  1. Stable basilar scarring.  2. No acute findings.  Original Report Authenticated By: Rosealee Albee, M.D.  ROS-see HPI Constitutional:   No-   weight loss, night sweats, fevers, chills, fatigue, lassitude. HEENT:   No-  headaches, difficulty swallowing, tooth/dental problems, sore throat,       No-  sneezing, itching, ear ache, nasal congestion, post nasal drip,  CV:  No-   chest pain, orthopnea, PND, swelling in lower extremities, anasarca, dizziness, palpitations Resp: + shortness of breath with exertion or at rest.              No-   productive cough,  + non-productive cough,  No- coughing up of blood.              No-   change in color of mucus.  No- wheezing.   Skin: No-   rash or lesions. GI:  + heartburn, indigestion, no-abdominal pain, nausea, vomiting, GU:  MS:  No-   joint pain or swelling.  + Chronic back pain Neuro-     nothing unusual Psych:  No- change in mood or affect. No depression or anxiety.  No memory loss.    Objective:   Physical Exam General- Alert, Oriented, Affect-appropriate, Distress- none acute  Medium build, very relaxed Skin- rash-none, lesions- none, excoriation- none Lymphadenopathy- none Head- atraumatic  Eyes- Gross vision intact, PERRLA, conjunctivae clear secretions            Ears- +hearing aids            Nose- Clear, no-Septal dev, mucus, polyps, erosion, perforation             Throat- Mallampati IV , mucosa clear , drainage- none, tonsils- atrophic,+ dentures Neck- flexible , trachea midline, no stridor , thyroid nl, carotid no bruit Chest - symmetrical excursion , unlabored           Heart/CV- RRR , no murmur , no gallop  , no rub, nl s1 s2                           - JVD- none , edema- none, stasis changes- none, varices- none           Lung- clear to P&A, wheeze- none, cough- none , dullness-none, rub- none           Chest wall-  Abd-  Br/ Gen/ Rectal- Not done, not indicated Extrem-  cyanosis- none, clubbing, none, atrophy- none, strength- nl Neuro- grossly intact to observation   Assessment & Plan:

## 2013-03-14 NOTE — Assessment & Plan Note (Signed)
We discussed comfort issues that might cause him to take the mask off in his sleep

## 2013-03-14 NOTE — Assessment & Plan Note (Signed)
His complaint of dyspnea sounds a little out of line of thought was his pulmonary status. Plan-schedule PFT with 6 minute walk test

## 2013-04-12 ENCOUNTER — Ambulatory Visit (INDEPENDENT_AMBULATORY_CARE_PROVIDER_SITE_OTHER): Payer: Medicare Other | Admitting: Internal Medicine

## 2013-04-12 DIAGNOSIS — R0609 Other forms of dyspnea: Secondary | ICD-10-CM

## 2013-04-12 DIAGNOSIS — R0600 Dyspnea, unspecified: Secondary | ICD-10-CM

## 2013-04-12 DIAGNOSIS — J449 Chronic obstructive pulmonary disease, unspecified: Secondary | ICD-10-CM

## 2013-04-12 LAB — PULMONARY FUNCTION TEST

## 2013-04-12 NOTE — Progress Notes (Signed)
PFT done today. 

## 2013-04-13 ENCOUNTER — Encounter (INDEPENDENT_AMBULATORY_CARE_PROVIDER_SITE_OTHER): Payer: Self-pay | Admitting: Internal Medicine

## 2013-04-13 ENCOUNTER — Ambulatory Visit (INDEPENDENT_AMBULATORY_CARE_PROVIDER_SITE_OTHER): Payer: Medicare Other | Admitting: Internal Medicine

## 2013-04-13 VITALS — BP 110/68 | HR 78 | Temp 98.1°F | Resp 16 | Ht 70.0 in | Wt 183.4 lb

## 2013-04-13 DIAGNOSIS — K219 Gastro-esophageal reflux disease without esophagitis: Secondary | ICD-10-CM

## 2013-04-13 DIAGNOSIS — R634 Abnormal weight loss: Secondary | ICD-10-CM

## 2013-04-13 NOTE — Progress Notes (Signed)
Presenting complaint;  Followup for GERD.  Subjective:  Patient is 72 year old Caucasian male who presents for yearly visit. He has been under a lot of stress recently and not doing well. His mother died a few weeks ago and his wife of over 40 years left him unannounced and now asking for settlement. He has lost 12 pounds in the last one month. His appetite is not good. He denies nausea vomiting or diarrhea. Heartburn is well controlled with therapy. Single-dose PPI results in frequent breakthrough symptoms. He rarely experiences heartburn. He also denies dysphagia. He strangles every now and then but the symptom is not getting worse. He has never been treated for pneumonia. He also complains of exertional dyspnea. Cardiac evaluation was negative. He is now being evaluated by Dr. Jetty Duhamel to rule out pulmonary disease.  Current Medications: Current Outpatient Prescriptions  Medication Sig Dispense Refill  . amitriptyline (ELAVIL) 50 MG tablet Take 50 mg by mouth at bedtime.      Marland Kitchen aspirin (BAYER LOW STRENGTH) 81 MG EC tablet Take 81 mg by mouth daily.        . beta carotene w/minerals (OCUVITE) tablet Take 1 tablet by mouth daily.      . brimonidine (ALPHAGAN) 0.2 % ophthalmic solution Place 1 drop into the right eye 2 (two) times daily.      Marland Kitchen esomeprazole (NEXIUM) 40 MG capsule Take 40 mg by mouth 2 (two) times daily.      . fenofibrate 160 MG tablet Take 160 mg by mouth daily.      Marland Kitchen latanoprost (XALATAN) 0.005 % ophthalmic solution Place 1 drop into the left eye at bedtime.      . Multiple Vitamin (MULTIVITAMIN) tablet Take 1 tablet by mouth daily.        . NON FORMULARY CPAP Use as directed      . PARoxetine (PAXIL) 20 MG tablet Take 40 mg by mouth daily.       Marland Kitchen Propylene Glycol (SYSTANE BALANCE) 0.6 % SOLN Apply 1 drop to eye as needed.       . simvastatin (ZOCOR) 80 MG tablet Take 80 mg by mouth at bedtime.      . sodium chloride (OCEAN) 0.65 % nasal spray Place 2 sprays into the  nose 3 (three) times daily.       . temazepam (RESTORIL) 30 MG capsule Take 1 capsule (30 mg total) by mouth at bedtime as needed.  30 capsule  5   No current facility-administered medications for this visit.      Objective: Blood pressure 110/68, pulse 78, temperature 98.1 F (36.7 C), temperature source Oral, resp. rate 16, height 5\' 10"  (1.778 m), weight 183 lb 6.4 oz (83.19 kg). Patient is alert and in no acute distress. Conjunctiva is pink. Sclera is nonicteric Oropharyngeal mucosa is dry but otherwise normal. No neck masses or thyromegaly noted. Cardiac exam with regular rhythm normal S1 and S2. No murmur or gallop noted. Lungs are clear to auscultation. Abdomen is full but soft and nontender without organomegaly or masses. No LE edema or clubbing noted.   Assessment:  #1. Chronic GERD. Symptoms well-controlled with therapy but he is requiring double dose PPI. He has had best results with Nexium. #2. Weight loss secondary to drop in appetite secondary to grief(loss of mother) and stress of separation with his wife. #3. History of colonic adenomas. Last colonoscopy was February 2012.    Plan:  Continue Nexium at 40 mg by mouth twice a day.  Weight check in one month. Office visit in one year unless weight loss continues.

## 2013-04-13 NOTE — Patient Instructions (Addendum)
Weight check in one month.  

## 2013-04-18 NOTE — Progress Notes (Signed)
Documentation for 6 minute walk test 

## 2013-04-21 ENCOUNTER — Encounter: Payer: Self-pay | Admitting: Internal Medicine

## 2013-04-28 ENCOUNTER — Telehealth (INDEPENDENT_AMBULATORY_CARE_PROVIDER_SITE_OTHER): Payer: Self-pay | Admitting: *Deleted

## 2013-04-28 NOTE — Telephone Encounter (Signed)
Patient states that he went to get his Nexium filled yesterday and the co-pay has gone from $68.00 a month to over $200 a month. He is asking that if he could try the Dexilant ,the co pay for it will be $90 per month. He was told that this was a generic for Nexium now. If Dr.Rehman is in an agreement ,he ask that it be faxed to the [facility] in [place]. He is taking Nexium 40 mg by mouth twice a day.

## 2013-04-28 NOTE — Telephone Encounter (Signed)
can send  prescription forDexlansoprazole 60 mg by mouth every morning 30 with 5 refills the patient's pharmacy

## 2013-04-28 NOTE — Telephone Encounter (Signed)
Needs to speak with Tammy about a medication problem he is having. Please call [phone] or [phone].

## 2013-04-28 NOTE — Telephone Encounter (Signed)
Dexilant 60 mg Take 1 by mouth every morning # 30  5 refills called to [facility]. Patient was called and made aware.

## 2013-05-11 ENCOUNTER — Ambulatory Visit (INDEPENDENT_AMBULATORY_CARE_PROVIDER_SITE_OTHER): Payer: Medicare Other | Admitting: Internal Medicine

## 2013-05-11 ENCOUNTER — Encounter: Payer: Self-pay | Admitting: Internal Medicine

## 2013-05-11 VITALS — BP 102/60 | HR 93 | Ht 69.5 in | Wt 183.2 lb

## 2013-05-11 DIAGNOSIS — J4489 Other specified chronic obstructive pulmonary disease: Secondary | ICD-10-CM

## 2013-05-11 DIAGNOSIS — J449 Chronic obstructive pulmonary disease, unspecified: Secondary | ICD-10-CM

## 2013-05-11 DIAGNOSIS — Z87891 Personal history of nicotine dependence: Secondary | ICD-10-CM

## 2013-05-11 DIAGNOSIS — G4733 Obstructive sleep apnea (adult) (pediatric): Secondary | ICD-10-CM

## 2013-05-11 NOTE — Progress Notes (Signed)
Subjective:    Patient ID: Tyler Perkins, male    DOB: [birth year], 72 y.o.   MRN: [id number]  HPI 02/25/11- 42 yoM former smoker (45 pack years) followed for COPD, hx lung nodule, OSA,complicated by chronic back pain.     Wife here    PCP Dr Sherril Croon Last here February 23, 2010 He has been concerned about exertional dyspnea. Quit smoking 3 years ago. Seen in June for cardiology assessment by Dr Diona Browner because of exertional dyspnea. Told stress test didn't show basis to justify cath, and that he should walk more aggressively. He is trying to do that, and says exercise tolerance is getting a little better. Scheduled for ablation procedure for his back pain. Has lost 10 lbs. CPAP 14- long term autoPAP 8-20. Able to use it all night every night. Prevents snore.   03/02/12-69 yoM former smoker (45 pack years) followed for COPD, hx lung nodule, OSA,complicated by chronic back pain.   Pt state wearing CPAP nightly approx 6 hrs. Pt states that he has a habit with pulling it off in the middle of the night. Pt denies any issue with mask or pressure.    Wife here CPAP autoPAP/ [employer]. He still pulls off mask occasionally in his sleep, usually in the last hour or so before waking. Has had injections for back pain which may give systemic steroid effect helpful to his breathing. CXR 02/26/11- IMPRESSION:  Stable basilar scarring. No active lung disease.  Original Report Authenticated By: Juline Patch, M.D.   03/05/13-69 yoM former smoker (45 pack years) followed for COPD, hx lung nodule, OSA,complicated by chronic back pain. FOLLOWS FOR:Pt reports he is wearing CPAP/ Auto/ [facility] everynight approx 5-6 hrs a night. He wakes up 3-4 AM and finds the mask off his face. he is not sure why he is taking off while sleeping.  He reports he is still SOB w/ activity, especially if he lifts, pushes or uses his arms..  Had stress test and good report from cardiology. Dry cough since April. Treats GERD  with Nexi CXR 03/04/13  IMPRESSION:  1. Stable basilar scarring.  2. No acute findings.  Original Report Authenticated By: Rosealee Albee, M.D.  05/11/13- 50 yoM former smoker (45 pack years) followed for COPD, hx lung nodule, OSA,complicated by chronic back pain. Wants to wait until November for flu shot FOLLOWS FOR:Had pft & walk in Sept.-wears CPAP 5-6 hrs. at night,,wakes up with mask off, new mask is good. Pulls CPAP off in his sleep but likes this mask and continues AutoPAP 8-20/ [employer]. Much stress-mother died and he is involved in legal matters against wife in separation. PFT 04/12/2013-normal spirometry flows, insignificant response to bronchodilator, normal lung volumes, diffusion mildly reduced. FVC 3.90/93%, FEV1 2.9/95%, FEV1/FVC 0.75, TLC 81%, DLCO 62%. 6 MWT- 04/12/13- 96%, 94%, 99%, 513 m. No oxygen limitation on this exercise   ROS-see HPI Constitutional:   No-   weight loss, night sweats, fevers, chills, fatigue, lassitude. HEENT:   No-  headaches, difficulty swallowing, tooth/dental problems, sore throat,       No-  sneezing, itching, ear ache, nasal congestion, post nasal drip,  CV:  No-   chest pain, orthopnea, PND, swelling in lower extremities, anasarca, dizziness, palpitations Resp: + shortness of breath with exertion or at rest.              No-   productive cough,  + non-productive cough,  No- coughing up of blood.  No-   change in color of mucus.  No- wheezing.   Skin: No-   rash or lesions. GI:  + heartburn, indigestion, no-abdominal pain, nausea, vomiting, GU:  MS:  No-   joint pain or swelling.  + Chronic back pain Neuro-     nothing unusual Psych:  No- change in mood or affect. No depression or anxiety.  No memory loss.    Objective:   Physical Exam General- Alert, Oriented, Affect-appropriate, Distress- none acute  Medium build, very relaxed Skin- rash-none, lesions- none, excoriation- none Lymphadenopathy- none Head-  atraumatic            Eyes- Gross vision intact, PERRLA, conjunctivae clear secretions            Ears- +hearing aids            Nose- Clear, no-Septal dev, mucus, polyps, erosion, perforation             Throat- Mallampati IV , mucosa clear , drainage- none, tonsils- atrophic,+ dentures Neck- flexible , trachea midline, no stridor , thyroid nl, carotid no bruit Chest - symmetrical excursion , unlabored           Heart/CV- RRR , no murmur , no gallop  , no rub, nl s1 s2                           - JVD- none , edema- none, stasis changes- none, varices- none           Lung- clear to P&A, wheeze- none, cough- none , dullness-none, rub- none           Chest wall-  Abd-  Br/ Gen/ Rectal- Not done, not indicated Extrem- cyanosis- none, clubbing, none, atrophy- none, strength- nl Neuro- grossly intact to observation   Assessment & Plan:

## 2013-05-11 NOTE — Patient Instructions (Addendum)
Order- [facility]- help find new DME for existing CPAP autotitrated 8-20 long term, mask of choice, supplies, humidifier     Dx OSA  Please call as needed

## 2013-05-17 ENCOUNTER — Telehealth (INDEPENDENT_AMBULATORY_CARE_PROVIDER_SITE_OTHER): Payer: Self-pay | Admitting: *Deleted

## 2013-05-17 NOTE — Telephone Encounter (Signed)
Patient came to office today for a weight check. His weight today was 178 lbs 7 oz. At his office visit 04/13/13 with Dr.Rehman he weighed 183 lbs 6.4 oz. Patient states that he feels this weight loss is related to all the stress that he is going through. He also states that he is doing great on the Dexilant. He was advised that Dr.Rehman would be made aware and we would call hm with his recommendations.

## 2013-05-19 NOTE — Telephone Encounter (Signed)
Will do weight check in 2 months.

## 2013-05-19 NOTE — Telephone Encounter (Signed)
Patient was called and a message was left on his voicemail with the recommendations from Dr.Rehman,weight check in 2 months.

## 2013-05-23 NOTE — Assessment & Plan Note (Signed)
6 minute walk test 04/12/2013-96%, 94%, 99%, 513 m. Incidental back pain but no oxygen limitation to this exercise  PFT 04/12/2013-normal spirometry flows, insignificant response to bronchodilator, normal lung volumes, diffusion mildly reduced. FVC 3.90/93%, FEV1 2.9/95%, FEV1/FVC 0.75, TLC 81%, DLCO 62%. Mild reduction in diffusion capacity is nonspecific and could be from heart or terminal airway emphysema too mild to affect flow rates. Plan-encouraged continued ambulation for endurance

## 2013-05-23 NOTE — Assessment & Plan Note (Signed)
He feels CPAP helps him. We discussed compliance and alternatives.

## 2013-05-23 NOTE — Assessment & Plan Note (Signed)
Long-term surveillance

## 2013-06-10 ENCOUNTER — Ambulatory Visit (INDEPENDENT_AMBULATORY_CARE_PROVIDER_SITE_OTHER): Payer: Medicare Other | Admitting: Cardiology

## 2013-06-10 ENCOUNTER — Encounter: Payer: Self-pay | Admitting: Cardiology

## 2013-06-10 VITALS — BP 145/78 | HR 109 | Ht 69.5 in | Wt 174.8 lb

## 2013-06-10 DIAGNOSIS — E7800 Pure hypercholesterolemia, unspecified: Secondary | ICD-10-CM

## 2013-06-10 DIAGNOSIS — R0609 Other forms of dyspnea: Secondary | ICD-10-CM

## 2013-06-10 DIAGNOSIS — I779 Disorder of arteries and arterioles, unspecified: Secondary | ICD-10-CM

## 2013-06-10 DIAGNOSIS — R0989 Other specified symptoms and signs involving the circulatory and respiratory systems: Secondary | ICD-10-CM

## 2013-06-10 NOTE — Patient Instructions (Signed)
Continue all current medications. Your physician wants you to follow up in: 6 months.  You will receive a reminder letter in the mail one-two months in advance.  If you don't receive a letter, please call our office to schedule the follow up appointment   

## 2013-06-10 NOTE — Assessment & Plan Note (Signed)
Continues on Zocor. Keep followup with Dr. Sherril Croon.

## 2013-06-10 NOTE — Assessment & Plan Note (Signed)
Stable pattern, likely multifactorial. From a cardiac perspective we continue observation. He is on aspirin and statin therapy. ECG without acute ST segment changes. Followup arranged.

## 2013-06-10 NOTE — Progress Notes (Signed)
Clinical Summary Tyler Perkins is a 72 y.o.male last seen in May. Interval pulmonary followup with Dr. Maple Hudson noted. History of COPD and also sleep apnea.  Tyler Perkins is under a tremendous amount of stress at this time. He tells me that his wife of 49 years left him suddenly, shortly following this his mother passed away. He has been dealing with an active divorce and also the executor of his mother's will.  Fortunately, he reports no progressive shortness of breath, no angina symptoms. He states that he has been much more active just because of all the things he is having to take care of, has not specifically been walking for exercise or relaxation. Today's ECG showed sinus tachycardia with vertical axis, no significant ST segment changes. 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  Exercise Cardiolite earlier this year demonstrated below average exercise tolerance but no ischemic changes by ECG or perfusion imaging, LVEF 65%. He achieved adequate heart rate response, 7 METS in stage 2.   Allergies  Allergen Reactions  . Dilaudid [Hydromorphone Hcl] Hives  . Codeine   . Erythromycin   . Hydromorphone Hcl     *Dilaudid*  . Ketorolac     persistent headaches  . Morphine   . Ultram [Tramadol]     confusion  . Lyrica [Pregabalin] Swelling    Current Outpatient Prescriptions  Medication Sig Dispense Refill  . amitriptyline (ELAVIL) 50 MG tablet Take 50 mg by mouth at bedtime.      Marland Kitchen aspirin (BAYER LOW STRENGTH) 81 MG EC tablet Take 81 mg by mouth daily.        . beta carotene w/minerals (OCUVITE) tablet Take 1 tablet by mouth daily.      . brimonidine (ALPHAGAN) 0.2 % ophthalmic solution Place 1 drop into the right eye 2 (two) times daily.      Marland Kitchen dexlansoprazole (DEXILANT) 60 MG capsule Take 60 mg by mouth daily.      . fenofibrate 160 MG tablet Take 160 mg by mouth daily.      Marland Kitchen latanoprost (XALATAN)  0.005 % ophthalmic solution Place 1 drop into the left eye at bedtime.      . meclizine (ANTIVERT) 25 MG tablet Take 25 mg by mouth 3 (three) times daily as needed.       . Multiple Vitamin (MULTIVITAMIN) tablet Take 1 tablet by mouth daily.        . NON FORMULARY CPAP Use as directed      . PARoxetine (PAXIL) 40 MG tablet Take 40 mg by mouth every morning.      Marland Kitchen Propylene Glycol (SYSTANE BALANCE) 0.6 % SOLN Apply 1 drop to eye as needed.       . simvastatin (ZOCOR) 80 MG tablet Take 80 mg by mouth at bedtime.      . sodium chloride (OCEAN) 0.65 % nasal spray Place 2 sprays into the nose as needed.       . temazepam (RESTORIL) 30 MG capsule Take 30 mg by mouth.       No current facility-administered medications for this visit.    Past Medical History  Diagnosis Date  . Lung nodule   . COPD (chronic obstructive pulmonary disease)   . Carotid arterial disease   . Obstructive sleep apnea   . GERD (gastroesophageal reflux disease)   . Mixed hyperlipidemia   . Macular degeneration  Bilateral    Past Surgical History  Procedure Laterality Date  . Cataract extraction    . Lymph node dissection  2007    VATS resection intraparenchymal lymph node  . Deviated septum repair    . Laparoscopic appendectomy    . Retinal detachment repair w/ scleral buckle le      Social History Tyler Perkins reports that he quit smoking about 5 years ago. His smoking use included Cigarettes. He has a 30 pack-year smoking history. He has never used smokeless tobacco. Tyler Perkins reports that he does not drink alcohol.  Review of Systems He is having trouble with hearing loss. Negative except as outlined.  Physical Examination Filed Vitals:   06/10/13 1010  BP: 145/78  Pulse: 109   Filed Weights   06/10/13 1010  Weight: 174 lb 12.8 oz (79.289 kg)    Overweight male in no acute distress.  HEENT: Conjunctiva and lids normal, oropharynx with moist mucosa.  Neck: Supple, no elevated JVP or  significant carotid bruit.  Lungs: Diminished but clear breath sounds, nonlabored.  Cardiac: Regular rate and rhythm, no significant murmur or gallop.  Abdomen: Soft, nontender, bowel sounds present.  Extremities: No pitting edema, distal pulses full.    Problem List and Plan   Dyspnea on exertion Stable pattern, likely multifactorial. From a cardiac perspective we continue observation. He is on aspirin and statin therapy. ECG without acute ST segment changes. Followup arranged.  HYPERCHOLESTEROLEMIA Continues on Zocor. Keep followup with Dr. Sherril Croon.    Jonelle Sidle, M.D., F.A.C.C.

## 2013-06-18 ENCOUNTER — Ambulatory Visit: Payer: PRIVATE HEALTH INSURANCE | Admitting: Cardiology

## 2013-07-22 ENCOUNTER — Telehealth (INDEPENDENT_AMBULATORY_CARE_PROVIDER_SITE_OTHER): Payer: Self-pay | Admitting: *Deleted

## 2013-07-22 NOTE — Telephone Encounter (Signed)
Patient presented to the office for his 2 month weight check. Today  His weight was 176 lbs. 05-17-13 he weighed 178 lbs 7 oz 04-13-13 he weighed 183 lbs 6.4 oz  Forwarded to Dr.Rehman as FYI.

## 2013-07-22 NOTE — Telephone Encounter (Signed)
It appears weight loss has slowed down or leveled off. Will recheck weight in 8 weeks.

## 2013-07-22 NOTE — Telephone Encounter (Signed)
Patient called and made aware that he will need to come back to the office 09/23/13 for a weight check.

## 2013-07-22 NOTE — Telephone Encounter (Signed)
Weight check = 176 lbs

## 2013-07-26 NOTE — Telephone Encounter (Signed)
Will recheck weight in 8 weeks 

## 2013-07-26 NOTE — Telephone Encounter (Signed)
This has been addressed.

## 2013-08-24 ENCOUNTER — Encounter (INDEPENDENT_AMBULATORY_CARE_PROVIDER_SITE_OTHER): Payer: Self-pay | Admitting: Internal Medicine

## 2013-08-24 ENCOUNTER — Ambulatory Visit (INDEPENDENT_AMBULATORY_CARE_PROVIDER_SITE_OTHER): Payer: Medicare Other | Admitting: Internal Medicine

## 2013-08-24 VITALS — BP 104/42 | HR 96 | Temp 97.1°F | Ht 69.5 in | Wt 173.6 lb

## 2013-08-24 DIAGNOSIS — R198 Other specified symptoms and signs involving the digestive system and abdomen: Secondary | ICD-10-CM

## 2013-08-24 DIAGNOSIS — R140 Abdominal distension (gaseous): Secondary | ICD-10-CM | POA: Insufficient documentation

## 2013-08-24 DIAGNOSIS — R143 Flatulence: Secondary | ICD-10-CM

## 2013-08-24 DIAGNOSIS — R142 Eructation: Secondary | ICD-10-CM

## 2013-08-24 DIAGNOSIS — R141 Gas pain: Secondary | ICD-10-CM

## 2013-08-24 NOTE — Patient Instructions (Signed)
Will get HIDA scan. Continue the Dexilant.  Avoid greasy foods.

## 2013-08-24 NOTE — Progress Notes (Signed)
Subjective:     Patient ID: Tyler Perkins, male   DOB: 1941/05/18, 73 y.o.   MRN: [id number]  HPI Presents today with fullness in his chest. If he lies down, he feels fullness in his esophagus. Symptoms x 4 days.  The only times he feels it, is when he lies down. Symptoms started after having a large meal.  He ate a hamburger steak, toast, cole slaw, potatoes and tea.  There is no problem swallowing.  No acid reflux. Appetite is good. He has lost 9.5 pounds since his last visit in September. He has been under a lot of stress. He lost his mother in August and his wife left him in July of this year.  February 2012 and he had surveillance colonoscopy. He had a small tubular adenoma removed. He also had external hemorrhoids  BMs are normal. Usually has a BM daily or ever other day. No melena or bright red rectal bleeding.    Review of Systems see hpi Current Outpatient Prescriptions  Medication Sig Dispense Refill  . amitriptyline (ELAVIL) 50 MG tablet Take 50 mg by mouth at bedtime.      Marland Kitchen aspirin (BAYER LOW STRENGTH) 81 MG EC tablet Take 81 mg by mouth daily.        . beta carotene w/minerals (OCUVITE) tablet Take 1 tablet by mouth daily.      . brimonidine (ALPHAGAN) 0.2 % ophthalmic solution Place 1 drop into the right eye 2 (two) times daily.      Marland Kitchen dexlansoprazole (DEXILANT) 60 MG capsule Take 60 mg by mouth daily.      . fenofibrate 160 MG tablet Take 160 mg by mouth daily.      Marland Kitchen latanoprost (XALATAN) 0.005 % ophthalmic solution Place 1 drop into the left eye at bedtime.      . meclizine (ANTIVERT) 25 MG tablet Take 25 mg by mouth 3 (three) times daily as needed.       . Multiple Vitamin (MULTIVITAMIN) tablet Take 1 tablet by mouth daily.        Marland Kitchen PARoxetine (PAXIL) 40 MG tablet Take 40 mg by mouth every morning.      Marland Kitchen Propylene Glycol (SYSTANE BALANCE) 0.6 % SOLN Apply 1 drop to eye as needed.       . simvastatin (ZOCOR) 80 MG tablet Take 80 mg by mouth at bedtime.      . sodium  chloride (OCEAN) 0.65 % nasal spray Place 2 sprays into the nose as needed.       . temazepam (RESTORIL) 30 MG capsule Take 30 mg by mouth.      . NON FORMULARY CPAP Use as directed       No current facility-administered medications for this visit.   Current Outpatient Prescriptions  Medication Sig Dispense Refill  . amitriptyline (ELAVIL) 50 MG tablet Take 50 mg by mouth at bedtime.      Marland Kitchen aspirin (BAYER LOW STRENGTH) 81 MG EC tablet Take 81 mg by mouth daily.        . beta carotene w/minerals (OCUVITE) tablet Take 1 tablet by mouth daily.      . brimonidine (ALPHAGAN) 0.2 % ophthalmic solution Place 1 drop into the right eye 2 (two) times daily.      Marland Kitchen dexlansoprazole (DEXILANT) 60 MG capsule Take 60 mg by mouth daily.      . fenofibrate 160 MG tablet Take 160 mg by mouth daily.      Marland Kitchen latanoprost (XALATAN) 0.005 %  ophthalmic solution Place 1 drop into the left eye at bedtime.      . meclizine (ANTIVERT) 25 MG tablet Take 25 mg by mouth 3 (three) times daily as needed.       . Multiple Vitamin (MULTIVITAMIN) tablet Take 1 tablet by mouth daily.        Marland Kitchen PARoxetine (PAXIL) 40 MG tablet Take 40 mg by mouth every morning.      Marland Kitchen Propylene Glycol (SYSTANE BALANCE) 0.6 % SOLN Apply 1 drop to eye as needed.       . simvastatin (ZOCOR) 80 MG tablet Take 80 mg by mouth at bedtime.      . sodium chloride (OCEAN) 0.65 % nasal spray Place 2 sprays into the nose as needed.       . temazepam (RESTORIL) 30 MG capsule Take 30 mg by mouth.      . NON FORMULARY CPAP Use as directed       No current facility-administered medications for this visit.   Past Medical History  Diagnosis Date  . Lung nodule   . COPD (chronic obstructive pulmonary disease)   . Carotid arterial disease   . Obstructive sleep apnea   . GERD (gastroesophageal reflux disease)   . Mixed hyperlipidemia   . Macular degeneration     Bilateral   Past Surgical History  Procedure Laterality Date  . Cataract extraction    .  Lymph node dissection  2007    VATS resection intraparenchymal lymph node  . Deviated septum repair    . Laparoscopic appendectomy    . Retinal detachment repair w/ scleral buckle le     Allergies  Allergen Reactions  . Dilaudid [Hydromorphone Hcl] Hives  . Codeine   . Erythromycin   . Hydromorphone Hcl     *Dilaudid*  . Ketorolac     persistent headaches  . Morphine   . Ultram [Tramadol]     confusion  . Lyrica [Pregabalin] Swelling        Objective:   Physical Exam  Filed Vitals:   08/24/13 0933  BP: 104/42  Pulse: 96  Temp: 97.1 F (36.2 C)  Height: 5' 9.5" (1.765 m)  Weight: 173 lb 9.6 oz (78.744 kg)  Alert and oriented. Skin warm and dry. Oral mucosa is moist.   . Sclera anicteric, conjunctivae is pink. Thyroid not enlarged. No cervical lymphadenopathy. Lungs clear. Heart regular rate and rhythm.  Abdomen is soft. Bowel sounds are positive. No hepatomegaly. No abdominal masses felt. No tenderness.  No edema to lower extremities.       Assessment:    Fullness in chest after recent heavy heavy meal. ? GB disease. GB disease needs to be ruled out. Hopefully this episode will be self-resolving.     Plan:    HIDA scan. Continue the Dexilant.

## 2013-08-26 ENCOUNTER — Telehealth (INDEPENDENT_AMBULATORY_CARE_PROVIDER_SITE_OTHER): Payer: Self-pay | Admitting: Internal Medicine

## 2013-08-26 ENCOUNTER — Encounter (HOSPITAL_COMMUNITY)
Admission: RE | Admit: 2013-08-26 | Discharge: 2013-08-26 | Disposition: A | Discharge status: Home / Self Care | Payer: Medicare Other | Source: Ambulatory Visit | Attending: Internal Medicine | Admitting: Internal Medicine

## 2013-08-26 ENCOUNTER — Encounter (HOSPITAL_COMMUNITY): Payer: Self-pay

## 2013-08-26 DIAGNOSIS — K219 Gastro-esophageal reflux disease without esophagitis: Secondary | ICD-10-CM | POA: Insufficient documentation

## 2013-08-26 DIAGNOSIS — R198 Other specified symptoms and signs involving the digestive system and abdomen: Secondary | ICD-10-CM

## 2013-08-26 DIAGNOSIS — R1013 Epigastric pain: Secondary | ICD-10-CM | POA: Insufficient documentation

## 2013-08-26 MED ORDER — SINCALIDE 5 MCG IJ SOLR
INTRAMUSCULAR | Status: AC
  Filled 2013-08-26: qty 5

## 2013-08-26 MED ORDER — STERILE WATER FOR INJECTION IJ SOLN
INTRAMUSCULAR | Status: AC
  Filled 2013-08-26: qty 10

## 2013-08-26 MED ORDER — TECHNETIUM TC 99M MEBROFENIN IV KIT
5.0000 | PACK | Freq: Once | INTRAVENOUS | Status: AC | PRN
  Administered 2013-08-26: 5 via INTRAVENOUS

## 2013-08-26 NOTE — Telephone Encounter (Signed)
No answer at home.Message left at home 

## 2013-08-27 ENCOUNTER — Telehealth (INDEPENDENT_AMBULATORY_CARE_PROVIDER_SITE_OTHER): Payer: Self-pay | Admitting: Internal Medicine

## 2013-08-27 NOTE — Telephone Encounter (Signed)
Message left at home 

## 2013-08-30 ENCOUNTER — Telehealth (INDEPENDENT_AMBULATORY_CARE_PROVIDER_SITE_OTHER): Payer: Self-pay | Admitting: Internal Medicine

## 2013-08-30 NOTE — Telephone Encounter (Signed)
Returning call.

## 2013-08-31 NOTE — Telephone Encounter (Signed)
Results given to patient

## 2013-10-08 ENCOUNTER — Telehealth (INDEPENDENT_AMBULATORY_CARE_PROVIDER_SITE_OTHER): Payer: Self-pay | Admitting: *Deleted

## 2013-10-08 ENCOUNTER — Other Ambulatory Visit (INDEPENDENT_AMBULATORY_CARE_PROVIDER_SITE_OTHER): Payer: Self-pay | Admitting: Internal Medicine

## 2013-10-08 DIAGNOSIS — K219 Gastro-esophageal reflux disease without esophagitis: Secondary | ICD-10-CM

## 2013-10-08 MED ORDER — DEXLANSOPRAZOLE 60 MG PO CPDR: 60.0000 mg | DELAYED_RELEASE_CAPSULE | Freq: Every day | ORAL | Status: DC

## 2013-10-08 NOTE — Telephone Encounter (Signed)
addressed

## 2013-10-08 NOTE — Telephone Encounter (Signed)
Tyler Perkins needs a refill on his Dexilant. He was told by [facility] in [place] to give our office a call because they were having computer trouble.

## 2013-12-20 ENCOUNTER — Ambulatory Visit: Payer: Medicare Other | Admitting: Cardiology

## 2014-01-04 ENCOUNTER — Ambulatory Visit (INDEPENDENT_AMBULATORY_CARE_PROVIDER_SITE_OTHER): Payer: Medicare Other | Admitting: Cardiology

## 2014-01-04 ENCOUNTER — Encounter: Payer: Self-pay | Admitting: Cardiology

## 2014-01-04 VITALS — BP 123/77 | HR 87 | Ht 69.0 in | Wt 171.0 lb

## 2014-01-04 DIAGNOSIS — R0609 Other forms of dyspnea: Secondary | ICD-10-CM

## 2014-01-04 DIAGNOSIS — R0989 Other specified symptoms and signs involving the circulatory and respiratory systems: Secondary | ICD-10-CM

## 2014-01-04 DIAGNOSIS — E7800 Pure hypercholesterolemia, unspecified: Secondary | ICD-10-CM

## 2014-01-04 NOTE — Patient Instructions (Signed)

## 2014-01-04 NOTE — Assessment & Plan Note (Signed)
He continues on Zocor, keep followup with Dr. Woody Seller.

## 2014-01-04 NOTE — Assessment & Plan Note (Addendum)
No significant change, likely multifactorial. From a cardiac perspective we continue observation. He is on aspirin and statin therapy. Followup arranged.

## 2014-01-04 NOTE — Progress Notes (Signed)
Clinical Summary [name] is a 73 y.o.male last seen in November 2014. He reports no angina symptoms, no progressive shortness of breath. Still indicates that he has been under a lot of emotional stress with family problems.  We reviewed his medications. He reports no major changes. States that he has a physical scheduled with Dr. Woody Seller in about a month.  Exercise Cardiolite in 2014 demonstrated below average exercise tolerance but no ischemic changes by ECG or perfusion imaging, LVEF 65%. He achieved adequate heart rate response, 7 METS in stage 2. Echocardiogram from December 2013 done at [facility] showed an LVEF of 55% with grade 1 diastolic dysfunction, trace tricuspid regurgitation, no pericardial effusion.   Allergies  Allergen Reactions  . Dilaudid [Hydromorphone Hcl] Hives  . Codeine   . Erythromycin   . Hydromorphone Hcl     *Dilaudid*  . Ketorolac     persistent headaches  . Morphine   . Ultram [Tramadol]     confusion  . Lyrica [Pregabalin] Swelling    Current Outpatient Prescriptions  Medication Sig Dispense Refill  . amitriptyline (ELAVIL) 50 MG tablet Take 50 mg by mouth at bedtime.      Marland Kitchen aspirin (BAYER LOW STRENGTH) 81 MG EC tablet Take 81 mg by mouth daily.        . beta carotene w/minerals (OCUVITE) tablet Take 1 tablet by mouth daily.      . brimonidine (ALPHAGAN) 0.2 % ophthalmic solution Place 1 drop into both eyes 3 (three) times daily.       . Carboxymethylcell-Hypromellose (GENTEAL) 0.25-0.3 % GEL Apply 1 drop to eye at bedtime. Both eyes      . dexlansoprazole (DEXILANT) 60 MG capsule Take 1 capsule (60 mg total) by mouth daily.  30 capsule  3  . fenofibrate 160 MG tablet Take 160 mg by mouth daily.      . meclizine (ANTIVERT) 25 MG tablet Take 25 mg by mouth as needed.       . Multiple Vitamin (MULTIVITAMIN) tablet Take 1 tablet by mouth daily.        . NON FORMULARY CPAP Use as directed      . PARoxetine (PAXIL) 40 MG tablet Take 40 mg  by mouth every morning.      Marland Kitchen Propylene Glycol (SYSTANE BALANCE) 0.6 % SOLN Apply 1 drop to eye 2 (two) times daily. Both eyes      . simvastatin (ZOCOR) 80 MG tablet Take 80 mg by mouth daily after supper.       . sodium chloride (OCEAN) 0.65 % nasal spray Place 2 sprays into the nose as needed.       . temazepam (RESTORIL) 30 MG capsule Take 30 mg by mouth at bedtime.        No current facility-administered medications for this visit.    Past Medical History  Diagnosis Date  . Lung nodule   . COPD (chronic obstructive pulmonary disease)   . Carotid arterial disease   . Obstructive sleep apnea   . GERD (gastroesophageal reflux disease)   . Mixed hyperlipidemia   . Macular degeneration     Bilateral    Past Surgical History  Procedure Laterality Date  . Cataract extraction    . Lymph node dissection  2007    VATS resection intraparenchymal lymph node  . Deviated septum repair    . Laparoscopic appendectomy    . Retinal detachment repair w/ scleral buckle le  Social History Mr. Stogdill reports that he quit smoking about 6 years ago. His smoking use included Cigarettes. He has a 30 pack-year smoking history. He has never used smokeless tobacco. Mr. Vanlanen reports that he does not drink alcohol.  Review of Systems No palpitations, dizziness, syncope, claudication. Otherwise as outlined.  Physical Examination Filed Vitals:   01/04/14 1012  BP: 123/77  Pulse: 87   Filed Weights   01/04/14 1012  Weight: 171 lb (77.565 kg)    No acute distress.  HEENT: Conjunctiva and lids normal, oropharynx with moist mucosa.  Neck: Supple, no elevated JVP or significant carotid bruit.  Lungs: Diminished but clear breath sounds, nonlabored.  Cardiac: Regular rate and rhythm, no significant murmur or gallop.  Abdomen: Soft, nontender, bowel sounds present.  Extremities: No pitting edema, distal pulses full.   Problem List and Plan   Dyspnea on exertion No significant change,  likely multifactorial. From a cardiac perspective we continue observation. He is on aspirin and statin therapy. Followup arranged.  HYPERCHOLESTEROLEMIA He continues on Zocor, keep followup with Dr. Woody Seller.    Satira Sark, M.D., F.A.C.C.

## 2014-01-18 ENCOUNTER — Encounter (INDEPENDENT_AMBULATORY_CARE_PROVIDER_SITE_OTHER): Payer: Self-pay | Admitting: *Deleted

## 2014-02-02 ENCOUNTER — Other Ambulatory Visit (INDEPENDENT_AMBULATORY_CARE_PROVIDER_SITE_OTHER): Payer: Self-pay | Admitting: Internal Medicine

## 2014-02-02 DIAGNOSIS — K219 Gastro-esophageal reflux disease without esophagitis: Secondary | ICD-10-CM

## 2014-02-02 NOTE — Telephone Encounter (Signed)
Dexilant reordered

## 2014-05-03 ENCOUNTER — Encounter (INDEPENDENT_AMBULATORY_CARE_PROVIDER_SITE_OTHER): Payer: Self-pay | Admitting: Internal Medicine

## 2014-05-03 ENCOUNTER — Ambulatory Visit (INDEPENDENT_AMBULATORY_CARE_PROVIDER_SITE_OTHER): Payer: Medicare Other | Admitting: Internal Medicine

## 2014-05-03 ENCOUNTER — Telehealth (INDEPENDENT_AMBULATORY_CARE_PROVIDER_SITE_OTHER): Payer: Self-pay | Admitting: *Deleted

## 2014-05-03 VITALS — BP 118/76 | HR 80 | Temp 97.4°F | Resp 18 | Ht 69.5 in | Wt 185.5 lb

## 2014-05-03 DIAGNOSIS — R195 Other fecal abnormalities: Secondary | ICD-10-CM

## 2014-05-03 DIAGNOSIS — R799 Abnormal finding of blood chemistry, unspecified: Secondary | ICD-10-CM

## 2014-05-03 DIAGNOSIS — Z860100 Personal history of colon polyps, unspecified: Secondary | ICD-10-CM

## 2014-05-03 DIAGNOSIS — R7989 Other specified abnormal findings of blood chemistry: Secondary | ICD-10-CM

## 2014-05-03 DIAGNOSIS — L290 Pruritus ani: Secondary | ICD-10-CM

## 2014-05-03 DIAGNOSIS — K219 Gastro-esophageal reflux disease without esophagitis: Secondary | ICD-10-CM

## 2014-05-03 LAB — CBC
HEMATOCRIT: 40.1 % (ref 39.0–52.0)
HEMOGLOBIN: 13.5 g/dL (ref 13.0–17.0)
MCH: 28.8 pg (ref 26.0–34.0)
MCHC: 33.7 g/dL (ref 30.0–36.0)
MCV: 85.7 fL (ref 78.0–100.0)
Platelets: 330 10*3/uL (ref 150–400)
RBC: 4.68 MIL/uL (ref 4.22–5.81)
RDW: 13.8 % (ref 11.5–15.5)
WBC: 9.4 10*3/uL (ref 4.0–10.5)

## 2014-05-03 LAB — BASIC METABOLIC PANEL
BUN: 18 mg/dL (ref 6–23)
CHLORIDE: 103 meq/L (ref 96–112)
CO2: 26 meq/L (ref 19–32)
CREATININE: 1.31 mg/dL (ref 0.50–1.35)
Calcium: 10.3 mg/dL (ref 8.4–10.5)
GLUCOSE: 88 mg/dL (ref 70–99)
POTASSIUM: 4.6 meq/L (ref 3.5–5.3)
Sodium: 139 mEq/L (ref 135–145)

## 2014-05-03 MED ORDER — NYSTATIN-TRIAMCINOLONE 100000-0.1 UNIT/GM-% EX CREA: 1.0000 "application " | TOPICAL_CREAM | Freq: Two times a day (BID) | CUTANEOUS | Status: DC

## 2014-05-03 NOTE — Patient Instructions (Signed)
Physician will call with results of blood tests and further recommendations. 

## 2014-05-03 NOTE — Telephone Encounter (Signed)
Taggart was told by [facility] in [place], they didn't received an e-scrip from Dr. Laural Golden. A Rx was sent. His return phone number is  [phone].

## 2014-05-03 NOTE — Progress Notes (Signed)
Presenting complaint;   Followup for GERD. Heme positive stool.  Subjective:  Patient is 73 year old Caucasian male who is here for scheduled visit. He was last seen in January this year by Ms. Setzer NP for postprandial bloating. He had a HIDA with CCK revealing EF of 95.8%. He had physical examination by Dr. Woody Seller in July 2015 and rectal examination was normal and stool was guaiac-negative. He subsequent had Hemoccults in one out of three was positive. This test was completed on 03/02/2014. He denies abdominal pain melena rectal bleeding diarrhea or constipation. He rarely has heartburn but he did have one yesterday after eating a sausage biscuit. He denies nausea or vomiting. He has gained 12 pounds in the last 8 months. He states his stress level has decreased and now he has a girlfriend who likes to cook. He has excessive flatulence but he states is from overeating. He occasionally takes Advil for headache.  Prior GI history; Last EGD with PEG was in July 2006. He has history of colonic adenomas but only one small polyp was discovered on his last colonoscopy of February 2012.   Current Medications: Outpatient Encounter Prescriptions as of 05/03/2014  Medication Sig  . amitriptyline (ELAVIL) 50 MG tablet Take 50 mg by mouth at bedtime.  Marland Kitchen aspirin (BAYER LOW STRENGTH) 81 MG EC tablet Take 81 mg by mouth daily.    . beta carotene w/minerals (OCUVITE) tablet Take 1 tablet by mouth daily.  . brimonidine (ALPHAGAN) 0.2 % ophthalmic solution Place 1 drop into both eyes 3 (three) times daily.  Marland Kitchen DEXILANT 60 MG capsule TAKE ONE CAPSULE BY MOUTH ONCE DAILY  . fenofibrate 160 MG tablet Take 160 mg by mouth daily.  . meclizine (ANTIVERT) 25 MG tablet Take 25 mg by mouth as needed.   . Multiple Vitamin (MULTIVITAMIN) tablet Take 1 tablet by mouth daily.    . NON FORMULARY CPAP Use as directed  . PARoxetine (PAXIL) 40 MG tablet Take 40 mg by mouth every morning.  Marland Kitchen Propylene Glycol (SYSTANE  BALANCE) 0.6 % SOLN Apply 1 drop to eye 2 (two) times daily. Both eyes  . simvastatin (ZOCOR) 80 MG tablet Take 80 mg by mouth daily after supper.   . sodium chloride (OCEAN) 0.65 % nasal spray Place 2 sprays into the nose as needed.   . temazepam (RESTORIL) 30 MG capsule Take 30 mg by mouth at bedtime.   . [DISCONTINUED] brimonidine (ALPHAGAN) 0.2 % ophthalmic solution Place 1 drop into both eyes 3 (three) times daily.   . [DISCONTINUED] Carboxymethylcell-Hypromellose (GENTEAL) 0.25-0.3 % GEL Apply 1 drop to eye at bedtime. Both eyes     Objective: Blood pressure 118/76, pulse 80, temperature 97.4 F (36.3 C), temperature source Oral, resp. rate 18, height 5' 9.5" (1.765 m), weight 185 lb 8 oz (84.142 kg). Patient is alert and in no acute distress. Conjunctiva is pink. Sclera is nonicteric Oropharyngeal mucosa is dry otherwise normal. No neck masses or thyromegaly noted. Cardiac exam with regular rhythm normal S1 and S2. No murmur or gallop noted. Lungs are clear to auscultation. Abdomen is full but soft and nontender without organomegaly or masses. Rectal examination reveals soft stool in the vault and it is guaiac-negative.  No LE edema or clubbing noted.  Labs/studies Results:  Lab data from 02/08/2014. WBC 8.4, H&H 14.3 and 42.2 and platelet count 323K Serum sodium 142, potassium 4.5, carotid 102, CO2 24, BUN 19, creatinine 1.44 Bilirubin 0.3, AP 46, AST 25, ALT 15, total protein 6.8 and  albumin 4.6. Serum calcium 10.4 TSH 1.580  Assessment:  #1. Heme positive stool noted on physical exam by Dr. Woody Seller. There is no history of melena or rectal bleeding. His stool is guaiac-negative today. H&H 10 weeks ago within normal limits. Patient's last colonoscopy was in February 2015 with removal of small adenoma. Therefore I do not believe he needs to go colonoscopy now unless H&H noted to have dropped. #2. GERD. Symptoms are well controlled with therapy. #3. Elevated serum creatinine and  borderline serum calcium.   Plan:  Will check CBC and metabolic 7 today. If H&H is low at proceed with colonoscopy now otherwise we'll repeat CBC and Hemoccult in 3 months.

## 2014-05-04 NOTE — Telephone Encounter (Signed)
Called [facility] in [place],per pharmacist they recd' the prescription  and it waiting for the patient to pick it up.Patient was called and made aware.

## 2014-05-05 ENCOUNTER — Telehealth (INDEPENDENT_AMBULATORY_CARE_PROVIDER_SITE_OTHER): Payer: Self-pay | Admitting: *Deleted

## 2014-05-05 DIAGNOSIS — K921 Melena: Secondary | ICD-10-CM

## 2014-05-05 NOTE — Telephone Encounter (Signed)
Per Dr.Rehman the patient will need to have labs drawn in 3 months , he will also need to have hemoccults at that time also.

## 2014-05-16 ENCOUNTER — Ambulatory Visit: Payer: Medicare Other | Admitting: Internal Medicine

## 2014-05-16 ENCOUNTER — Encounter: Payer: Self-pay | Admitting: Internal Medicine

## 2014-05-16 VITALS — BP 118/64 | HR 74 | Ht 70.0 in | Wt 190.4 lb

## 2014-05-16 DIAGNOSIS — G4733 Obstructive sleep apnea (adult) (pediatric): Secondary | ICD-10-CM

## 2014-05-16 MED ORDER — CLONAZEPAM 0.5 MG PO TABS: ORAL_TABLET | ORAL | Status: DC

## 2014-05-16 NOTE — Patient Instructions (Signed)
Script for clonazepam to try instead of temazepam. Try taking 1-2, maybe 20 minutes before you plan to fall asleep  We will continue CPAP auto/ Advanced  Please call as needed

## 2014-05-16 NOTE — Progress Notes (Signed)
Subjective:    Patient ID: Tyler Perkins, male    DOB: [birth year], 72 y.o.   MRN: [id number]  HPI 02/25/11- 60 yoM former smoker (45 pack years) followed for COPD, hx lung nodule, OSA,complicated by chronic back pain.     Wife here    PCP Dr Woody Seller Last here February 23, 2010 He has been concerned about exertional dyspnea. Quit smoking 3 years ago. Seen in June for cardiology assessment by Dr Domenic Polite because of exertional dyspnea. Told stress test didn't show basis to justify cath, and that he should walk more aggressively. He is trying to do that, and says exercise tolerance is getting a little better. Scheduled for ablation procedure for his back pain. Has lost 10 lbs. CPAP 14- long term autoPAP 8-20. Able to use it all night every night. Prevents snore.   03/02/12-69 yoM former smoker (45 pack years) followed for COPD, hx lung nodule, OSA,complicated by chronic back pain.   Pt state wearing CPAP nightly approx 6 hrs. Pt states that he has a habit with pulling it off in the middle of the night. Pt denies any issue with mask or pressure.    Wife here CPAP autoPAP/ [employer]. He still pulls off mask occasionally in his sleep, usually in the last hour or so before waking. Has had injections for back pain which may give systemic steroid effect helpful to his breathing. CXR 02/26/11- IMPRESSION:  Stable basilar scarring. No active lung disease.  Original Report Authenticated By: Joretta Bachelor, M.D.   03/05/13-69 yoM former smoker (45 pack years) followed for COPD, hx lung nodule, OSA,complicated by chronic back pain. FOLLOWS FOR:Pt reports he is wearing CPAP/ Auto/ [facility] everynight approx 5-6 hrs a night. He wakes up 3-4 AM and finds the mask off his face. he is not sure why he is taking off while sleeping.  He reports he is still SOB w/ activity, especially if he lifts, pushes or uses his arms..  Had stress test and good report from cardiology. Dry cough since April. Treats GERD  with Nexi CXR 03/04/13  IMPRESSION:  1. Stable basilar scarring.  2. No acute findings.  Original Report Authenticated By: Angelita Ingles, M.D.  05/11/13- 72 yoM former smoker (45 pack years) followed for COPD, hx lung nodule, OSA,complicated by chronic back pain. Wants to wait until November for flu shot FOLLOWS FOR:Had pft & 24min walk in Sept.-wears CPAP 5-6 hrs. at night,,wakes up with mask off, new mask is good. Pulls CPAP off in his sleep but likes this mask and continues AutoPAP 8-20/ [employer]. Much stress-mother died and he is involved in legal matters against wife in separation. PFT 04/12/2013-normal spirometry flows, insignificant response to bronchodilator, normal lung volumes, diffusion mildly reduced. FVC 3.90/93%, FEV1 2.9/95%, FEV1/FVC 0.75, TLC 81%, DLCO 62%. 6 MWT- 04/12/13- 96%, 94%, 99%, 513 m. No oxygen limitation on this exercise  05/16/14- 73 yoM former smoker (45 pack years) followed for COPD, hx lung nodule, OSA,complicated by chronic back pain FOLLOW FOR:  COPD; OSA; Pulling CPAP(auto/ [employer]) mask off every night, it fits well, but he doesn't know why he is pulling it off every night; no other complaints    ROS-see HPI Constitutional:   No-   weight loss, night sweats, fevers, chills, fatigue, lassitude. HEENT:   No-  headaches, difficulty swallowing, tooth/dental problems, sore throat,       No-  sneezing, itching, ear ache, nasal congestion, post nasal drip,  CV:  No-  chest pain, orthopnea, PND, swelling in lower extremities, anasarca, dizziness, palpitations Resp: + shortness of breath with exertion or at rest.              No-   productive cough,  + non-productive cough,  No- coughing up of blood.              No-   change in color of mucus.  No- wheezing.   Skin: No-   rash or lesions. GI:  + heartburn, indigestion, no-abdominal pain, nausea, vomiting, GU:  MS:  No-   joint pain or swelling.  + Chronic back pain Neuro-     nothing  unusual Psych:  No- change in mood or affect. No depression or anxiety.  No memory loss.    Objective:   Physical Exam General- Alert, Oriented, Affect-appropriate, Distress- none acute  Medium build, very relaxed Skin- rash-none, lesions- none, excoriation- none Lymphadenopathy- none Head- atraumatic            Eyes- Gross vision intact, PERRLA, conjunctivae clear secretions            Ears- +hearing aids            Nose- Clear, no-Septal dev, mucus, polyps, erosion, perforation             Throat- Mallampati IV , mucosa clear , drainage- none, tonsils- atrophic,+ dentures Neck- flexible , trachea midline, no stridor , thyroid nl, carotid no bruit Chest - symmetrical excursion , unlabored           Heart/CV- RRR , no murmur , no gallop  , no rub, nl s1 s2                           - JVD- none , edema- none, stasis changes- none, varices- none           Lung- clear to P&A, wheeze- none, cough- none , dullness-none, rub- none           Chest wall-  Abd-  Br/ Gen/ Rectal- Not done, not indicated Extrem- cyanosis- none, clubbing, none, atrophy- none, strength- nl Neuro- grossly intact to observation   Assessment & Plan:

## 2014-06-08 ENCOUNTER — Ambulatory Visit (INDEPENDENT_AMBULATORY_CARE_PROVIDER_SITE_OTHER): Payer: Medicare Other | Admitting: Cardiology

## 2014-06-08 ENCOUNTER — Encounter: Payer: Self-pay | Admitting: Cardiology

## 2014-06-08 VITALS — BP 119/79 | HR 90 | Ht 70.0 in | Wt 183.0 lb

## 2014-06-08 DIAGNOSIS — R0609 Other forms of dyspnea: Secondary | ICD-10-CM | POA: Diagnosis not present

## 2014-06-08 DIAGNOSIS — E7800 Pure hypercholesterolemia, unspecified: Secondary | ICD-10-CM

## 2014-06-08 DIAGNOSIS — E785 Hyperlipidemia, unspecified: Secondary | ICD-10-CM | POA: Diagnosis not present

## 2014-06-08 DIAGNOSIS — Z136 Encounter for screening for cardiovascular disorders: Secondary | ICD-10-CM

## 2014-06-08 NOTE — Assessment & Plan Note (Signed)
He states that he has been feeling better in general. Previous cardiac workup found normal LVEF and no major ischemic burden on testing last year. ECG reviewed. Would continue observation for now. General health maintenance and exercise recommended.

## 2014-06-08 NOTE — Progress Notes (Signed)
Reason for visit: Dyspnea on exertion, hypertension  Clinical Summary Tyler Perkins is a 73 y.o.male last seen in June. He presents for a routine visit. He tells that he is actually been feeling much better in general, less short of breath, seems to have more energy. He reports no exertional chest pain. ECG today showed sinus rhythm with counterclockwise rotation.  Exercise Cardiolite in 2014 demonstrated below average exercise tolerance but no ischemic changes by ECG or perfusion imaging, LVEF 65%. He achieved adequate heart rate response, 7 METS in stage 2. Echocardiogram from December 2013 done at [facility] showed an LVEF of 55% with grade 1 diastolic dysfunction, trace tricuspid regurgitation, no pericardial effusion.  I reviewed his medications today. He continues on aspirin and statin therapy. Lipids have been followed by Dr. Woody Seller.  Allergies  Allergen Reactions  . Dilaudid [Hydromorphone Hcl] Hives  . Hydromorphone Hcl Rash    *Dilaudid* - severe rash  . Morphine Rash  . Codeine     Confusion  . Erythromycin     Stomach pain  . Ketorolac     persistent headaches  . Ultram [Tramadol]     confusion  . Lyrica [Pregabalin] Swelling    Current Outpatient Prescriptions  Medication Sig Dispense Refill  . amitriptyline (ELAVIL) 50 MG tablet Take 50 mg by mouth at bedtime.    Marland Kitchen aspirin (BAYER LOW STRENGTH) 81 MG EC tablet Take 81 mg by mouth daily.      . beta carotene w/minerals (OCUVITE) tablet Take 1 tablet by mouth daily.    . brimonidine (ALPHAGAN) 0.2 % ophthalmic solution Place 1 drop into both eyes 3 (three) times daily.    . clonazePAM (KLONOPIN) 1 MG tablet Take 1 mg by mouth at bedtime.    Marland Kitchen DEXILANT 60 MG capsule TAKE ONE CAPSULE BY MOUTH ONCE DAILY 30 capsule 8  . fenofibrate 160 MG tablet Take 160 mg by mouth daily.    . meclizine (ANTIVERT) 25 MG tablet Take 25 mg by mouth as needed.     . Multiple Vitamin (MULTIVITAMIN) tablet Take 1 tablet by mouth  daily.      . NON FORMULARY CPAP Use as directed    . nystatin-triamcinolone (MYCOLOG II) cream Apply 1 application topically 2 (two) times daily. 30 g 0  . PARoxetine (PAXIL) 40 MG tablet Take 40 mg by mouth every morning.    Marland Kitchen Propylene Glycol (SYSTANE BALANCE) 0.6 % SOLN Apply 1 drop to eye 2 (two) times daily. Both eyes    . simvastatin (ZOCOR) 80 MG tablet Take 80 mg by mouth daily after supper.      No current facility-administered medications for this visit.    Past Medical History  Diagnosis Date  . Lung nodule   . COPD (chronic obstructive pulmonary disease)   . Carotid arterial disease   . Obstructive sleep apnea   . GERD (gastroesophageal reflux disease)   . Mixed hyperlipidemia   . Macular degeneration     Bilateral    Past Surgical History  Procedure Laterality Date  . Cataract extraction    . Lymph node dissection  2007    VATS resection intraparenchymal lymph node  . Deviated septum repair    . Laparoscopic appendectomy    . Retinal detachment repair w/ scleral buckle le      Social History Tyler Perkins reports that he quit smoking about 6 years ago. His smoking use included Cigarettes. He has a 30 pack-year smoking history. He has  never used smokeless tobacco. Tyler Perkins reports that he does not drink alcohol.  Review of Systems Complete review of systems negative except as otherwise outlined in the clinical summary.  Physical Examination Filed Vitals:   06/08/14 1325  BP: 119/79  Pulse: 90   Filed Weights   06/08/14 1325  Weight: 183 lb (83.008 kg)    No acute distress.  HEENT: Conjunctiva and lids normal, oropharynx with moist mucosa.  Neck: Supple, no elevated JVP or significant carotid bruit.  Lungs: Diminished but clear breath sounds, nonlabored.  Cardiac: Regular rate and rhythm, no significant murmur or gallop.  Abdomen: Soft, nontender, bowel sounds present.  Extremities: No pitting edema, distal pulses full.   Problem List and  Plan   Dyspnea on exertion He states that he has been feeling better in general. Previous cardiac workup found normal LVEF and no major ischemic burden on testing last year. ECG reviewed. Would continue observation for now. General health maintenance and exercise recommended.  HYPERCHOLESTEROLEMIA He continues on Zocor. Lipids have been followed by Dr. Woody Seller.    Satira Sark, M.D., F.A.C.C.

## 2014-06-08 NOTE — Assessment & Plan Note (Signed)
He continues on Zocor. Lipids have been followed by Dr. Woody Seller.

## 2014-06-08 NOTE — Patient Instructions (Signed)

## 2014-08-02 ENCOUNTER — Other Ambulatory Visit (INDEPENDENT_AMBULATORY_CARE_PROVIDER_SITE_OTHER): Payer: Self-pay | Admitting: *Deleted

## 2014-08-02 ENCOUNTER — Encounter (INDEPENDENT_AMBULATORY_CARE_PROVIDER_SITE_OTHER): Payer: Self-pay | Admitting: *Deleted

## 2014-08-02 DIAGNOSIS — K921 Melena: Secondary | ICD-10-CM

## 2014-08-10 LAB — CBC
HCT: 40.3 % (ref 39.0–52.0)
Hemoglobin: 14 g/dL (ref 13.0–17.0)
MCH: 29.8 pg (ref 26.0–34.0)
MCHC: 34.7 g/dL (ref 30.0–36.0)
MCV: 85.7 fL (ref 78.0–100.0)
MPV: 9.9 fL (ref 8.6–12.4)
Platelets: 355 10*3/uL (ref 150–400)
RBC: 4.7 MIL/uL (ref 4.22–5.81)
RDW: 13.8 % (ref 11.5–15.5)
WBC: 8.7 10*3/uL (ref 4.0–10.5)

## 2014-08-15 ENCOUNTER — Telehealth (INDEPENDENT_AMBULATORY_CARE_PROVIDER_SITE_OTHER): Payer: Self-pay | Admitting: *Deleted

## 2014-08-15 NOTE — Telephone Encounter (Signed)
   Diagnosis:    Result(s)   Card 1:Negative:     Card 2:: Negative:   Card 3: Negative:    Completed by: Mandi Mattioli,LPN   HEMOCCULT SENSA DEVELOPER: LOT#:  [id number] EXPIRATION DATE: 2016-05   HEMOCCULT SENSA CARD:  LOT#:  02/14 EXPIRATION DATE: 07/18   CARD CONTROL RESULTS:  POSITIVE: Posittive NEGATIVE: Negative    ADDITIONAL COMMENTS:  Patient was called and made aware.

## 2014-08-16 NOTE — Telephone Encounter (Signed)
All 3 Hemoccults are negative. Recent H&H was also normal. Patient aware of all the results. No other workup at this time.

## 2014-10-13 ENCOUNTER — Other Ambulatory Visit (INDEPENDENT_AMBULATORY_CARE_PROVIDER_SITE_OTHER): Payer: Self-pay | Admitting: Internal Medicine

## 2014-11-07 ENCOUNTER — Ambulatory Visit (INDEPENDENT_AMBULATORY_CARE_PROVIDER_SITE_OTHER): Payer: Medicare Other | Admitting: Internal Medicine

## 2014-11-07 VITALS — BP 128/78 | HR 72 | Temp 98.1°F | Resp 18 | Ht 70.0 in | Wt 188.5 lb

## 2014-11-07 DIAGNOSIS — K219 Gastro-esophageal reflux disease without esophagitis: Secondary | ICD-10-CM

## 2014-11-07 DIAGNOSIS — Z86010 Personal history of colonic polyps: Secondary | ICD-10-CM | POA: Diagnosis not present

## 2014-11-08 ENCOUNTER — Encounter (INDEPENDENT_AMBULATORY_CARE_PROVIDER_SITE_OTHER): Payer: Self-pay | Admitting: Internal Medicine

## 2014-11-08 NOTE — Progress Notes (Signed)
Presenting complaint;  Follow-up for GERD.  Subjective:  Patient is 74 year old Caucasian male was chronic GERD and is here for scheduled visit. He was last seen in September 2015 for heme positive stool. He feels fine. He continues to complain of dry mouth but it has improved since he is taking less amitriptyline. He has occasional heartburn with certain foods. He denies dysphagia no external regurgitation sore throat chronic hoarseness or cough. He is using CPAP at night. His bowels move daily. He denies melena or rectal bleeding. He complains of sporadic right-sided pain which is usually followed by urgency and bowel movement but he has not had an episode in few months. While he does not do regular exercise, he stays busy. He also does volunteer work with hospice, takes care of his horses home and swimming pool. He is quite upset about the fact that his plans to get married felt apart after all arrangements have been made.   Current Medications: Outpatient Encounter Prescriptions as of 11/07/2014  Medication Sig  . amitriptyline (ELAVIL) 50 MG tablet Take 50 mg by mouth at bedtime.  Marland Kitchen aspirin (BAYER LOW STRENGTH) 81 MG EC tablet Take 81 mg by mouth daily.    . beta carotene w/minerals (OCUVITE) tablet Take 1 tablet by mouth daily.  . brimonidine (ALPHAGAN) 0.2 % ophthalmic solution Place 1 drop into both eyes 3 (three) times daily.  . clonazePAM (KLONOPIN) 1 MG tablet Take 1 mg by mouth at bedtime.  Marland Kitchen DEXILANT 60 MG capsule TAKE 1 CAPSULE BY MOUTH EVERY DAY  . fenofibrate 160 MG tablet Take 160 mg by mouth daily.  . meclizine (ANTIVERT) 25 MG tablet Take 25 mg by mouth as needed.   . Multiple Vitamin (MULTIVITAMIN) tablet Take 1 tablet by mouth daily.    . NON FORMULARY CPAP Use as directed  . nystatin-triamcinolone (MYCOLOG II) cream Apply 1 application topically 2 (two) times daily.  Marland Kitchen PARoxetine (PAXIL) 40 MG tablet Take 40 mg by mouth every morning.  Marland Kitchen Propylene Glycol (SYSTANE  BALANCE) 0.6 % SOLN Apply 1 drop to eye 2 (two) times daily. Both eyes  . simvastatin (ZOCOR) 80 MG tablet Take 80 mg by mouth daily after supper.      Objective: Blood pressure 128/78, pulse 72, temperature 98.1 F (36.7 C), temperature source Oral, resp. rate 18, height 5\' 10"  (1.778 m), weight 188 lb 8 oz (85.503 kg). Patient is alert and in no acute distress. Conjunctiva is pink. Sclera is nonicteric Oropharyngeal mucosa is dry. Oropharyngeal mucosa is normal. No neck masses or thyromegaly noted. Cardiac exam with regular rhythm normal S1 and S2. No murmur or gallop noted. Lungs are clear to auscultation. Abdomen is soft and nontender without organomegaly or masses.  No LE edema or clubbing noted.   Assessment:  #1. GERD. Symptoms are well controlled with therapy. #2. History of colonic adenomas. Last colonoscopy was in February 2012 with removal of single small tubular adenoma.   Plan:  Continue anti-reflux measures. Continue Dexilant at 60 mg po qd. Will request a copy of recent left from Dr. Marcial Pacas office. Next colonoscopy in 1 year. Office visit in 6 months.

## 2014-12-12 ENCOUNTER — Other Ambulatory Visit (INDEPENDENT_AMBULATORY_CARE_PROVIDER_SITE_OTHER): Payer: Self-pay | Admitting: Internal Medicine

## 2014-12-12 ENCOUNTER — Encounter: Payer: Self-pay | Admitting: Cardiology

## 2014-12-12 ENCOUNTER — Ambulatory Visit (INDEPENDENT_AMBULATORY_CARE_PROVIDER_SITE_OTHER): Payer: Medicare Other | Admitting: Cardiology

## 2014-12-12 VITALS — BP 115/75 | HR 91 | Ht 70.0 in | Wt 186.8 lb

## 2014-12-12 DIAGNOSIS — E7800 Pure hypercholesterolemia, unspecified: Secondary | ICD-10-CM

## 2014-12-12 DIAGNOSIS — R0609 Other forms of dyspnea: Secondary | ICD-10-CM

## 2014-12-12 NOTE — Patient Instructions (Signed)

## 2014-12-12 NOTE — Progress Notes (Signed)
Cardiology Office Note  Date: 12/12/2014   ID: Tyler Perkins, DOB 25-Jan-1941, MRN [id number]  PCP: Tyler Labrum, MD  Primary Cardiologist: Tyler Lesches, MD   Chief Complaint  Patient presents with  . Hyperlipidemia  . Follow-up shortness of breath    History of Present Illness: Tyler Perkins is a 74 y.o. male last seen in November 2015. He presents for a routine visit. Overall, feels like things have been more positive in his life since I saw him. 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 He reports no angina symptoms or increasing shortness of breath. States that he has been compliant with CPAP.  He is now following with Dr. Pleas Perkins, has a physical and lab work pending for July.  He reports no hospitalizations or other interval illnesses.   Past Medical History  Diagnosis Date  . Lung nodule   . COPD (chronic obstructive pulmonary disease)   . Obstructive sleep apnea   . GERD (gastroesophageal reflux disease)   . Mixed hyperlipidemia   . Macular degeneration     Bilateral    Past Surgical History  Procedure Laterality Date  . Cataract extraction    . Lymph node dissection  2007    VATS resection intraparenchymal lymph node  . Deviated septum repair    . Laparoscopic appendectomy    . Retinal detachment repair w/ scleral buckle le      Current Outpatient Prescriptions  Medication Sig Dispense Refill  . amitriptyline (ELAVIL) 50 MG tablet Take 50 mg by mouth at bedtime.    Marland Kitchen aspirin (BAYER LOW STRENGTH) 81 MG EC tablet Take 81 mg by mouth daily.      . beta carotene w/minerals (OCUVITE) tablet Take 1 tablet by mouth daily.    . brimonidine (ALPHAGAN) 0.2 % ophthalmic solution Place 1 drop into both eyes 3 (three) times daily.    . clonazePAM (KLONOPIN) 1 MG tablet Take 1 mg by mouth at bedtime.    Marland Kitchen DEXILANT 60 MG capsule TAKE 1 CAPSULE BY MOUTH EVERY DAY 30  capsule 1  . fenofibrate 160 MG tablet Take 160 mg by mouth daily with supper.     . meclizine (ANTIVERT) 25 MG tablet Take 25 mg by mouth as needed.     . Multiple Vitamin (MULTIVITAMIN) tablet Take 1 tablet by mouth daily.      . NON FORMULARY CPAP Use as directed    . PARoxetine (PAXIL) 40 MG tablet Take 40 mg by mouth every morning.    . simvastatin (ZOCOR) 80 MG tablet Take 80 mg by mouth daily after supper.      No current facility-administered medications for this visit.    Allergies:  Dilaudid; Hydromorphone hcl; Morphine; Codeine; Erythromycin; Ketorolac; Ultram; and Lyrica   Social History: The patient  reports that he quit smoking about 7 years ago. His smoking use included Cigarettes. He has a 30 pack-year smoking history. He has never used smokeless tobacco. He reports that he does not drink alcohol or use illicit drugs.   ROS:  Please see the history of present illness. Otherwise, complete review of systems is positive for none.  All other systems are reviewed and negative.   Physical Exam: VS:  BP 115/75 mmHg  Pulse 91  Ht 5\' 10"  (1.778 m)  Wt 186 lb 12.8 oz (84.732 kg)  BMI 26.80 kg/m2  SpO2 95%, BMI Body mass index  is 26.8 kg/(m^2).  Wt Readings from Last 3 Encounters:  12/12/14 186 lb 12.8 oz (84.732 kg)  11/08/14 188 lb 8 oz (85.503 kg)  06/08/14 183 lb (83.008 kg)     No acute distress.  HEENT: Conjunctiva and lids normal, oropharynx with moist mucosa.  Neck: Supple, no elevated JVP or significant carotid bruit.  Lungs: Diminished but clear breath sounds, nonlabored.  Cardiac: Regular rate and rhythm, no significant murmur or gallop.  Abdomen: Soft, nontender, bowel sounds present.  Extremities: No pitting edema, distal pulses full.   ECG: Tracing from November 2015 showed sinus rhythm with possible pulmonary disease pattern.   Recent Labwork: 05/03/2014: BUN 18; Creatinine 1.31; Potassium 4.6; Sodium 139 08/09/2014: Hemoglobin 14.0; Platelets 355     Other Studies Reviewed Today:  1. Exercise Cardiolite in 2014 demonstrated below average exercise tolerance but no ischemic changes by ECG or perfusion imaging, LVEF 65%. He achieved adequate heart rate response, 7 METS in stage 2.   2. Echocardiogram from December 2013 done at [facility] showed an LVEF of 55% with grade 1 diastolic dysfunction, trace tricuspid regurgitation, no pericardial effusion.  Assessment and Plan:  1. History of shortness of breath, likely multifactorial, stable of late. He has COPD and cardiac workup is been reassuring as outlined above. Continue observation.  2. Hyperlipidemia, on Zocor. Keep follow-up with Dr. Pleas Perkins for lab work.  3. Obstructive sleep apnea, on CPAP.  Current medicines were reviewed with the patient today.   Disposition: FU with me in 1 year.   Signed, Tyler Sark, MD, [facility] 12/12/2014 1:15 PM    [facility] [place], [place], [place] [address] Phone: [phone]; Fax: [phone]

## 2015-02-14 ENCOUNTER — Encounter (INDEPENDENT_AMBULATORY_CARE_PROVIDER_SITE_OTHER): Payer: Self-pay | Admitting: *Deleted

## 2015-05-09 ENCOUNTER — Encounter (INDEPENDENT_AMBULATORY_CARE_PROVIDER_SITE_OTHER): Payer: Self-pay | Admitting: Internal Medicine

## 2015-05-09 ENCOUNTER — Ambulatory Visit (INDEPENDENT_AMBULATORY_CARE_PROVIDER_SITE_OTHER): Payer: Medicare Other | Admitting: Internal Medicine

## 2015-05-09 VITALS — BP 132/70 | HR 72 | Temp 99.5°F | Ht 70.0 in | Wt 183.0 lb

## 2015-05-09 DIAGNOSIS — K219 Gastro-esophageal reflux disease without esophagitis: Secondary | ICD-10-CM | POA: Diagnosis not present

## 2015-05-09 DIAGNOSIS — D126 Benign neoplasm of colon, unspecified: Secondary | ICD-10-CM

## 2015-05-09 IMAGING — NM NM HEPATO W/GB/PHARM/[PERSON_NAME]
2 series · 12 of 12 positions shown · non-contrast
Comparison: CT abdomen and pelvis -01/23/2006

RADIOPHARMACEUTICALS:  5 mNi9c-88m Choletec

CLINICAL DATA: Acid reflux, fullness within the chest, evaluate for
biliary dyskinesia

EXAM:
NUCLEAR MEDICINE HEPATOBILIARY IMAGING WITH GALLBLADDER EF
TECHNIQUE: Sequential images of the abdomen were obtained [DATE] minutes
following intravenous administration of radiopharmaceutical. After
slow intravenous infusion of 1.59 micrograms Cholecystokinin,
gallbladder ejection fraction was determined.

[hida · 3.20mm/px · 6 of 30 frames shown (1 of 2)]
[frame 3/30]
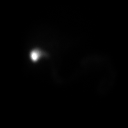
[frame 8/30]
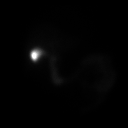
[frame 13/30]
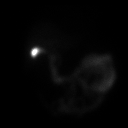
[frame 18/30]
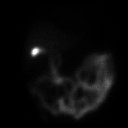
[frame 23/30]
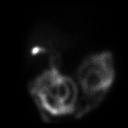
[frame 28/30]
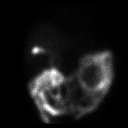

[hida · 3.20mm/px · 6 of 60 frames shown (2 of 2)]
[frame 6/60]
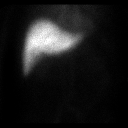
[frame 16/60]
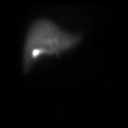
[frame 26/60]
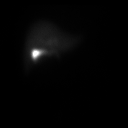
[frame 36/60]
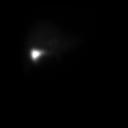
[frame 46/60]
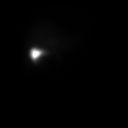
[frame 56/60]
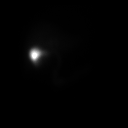

[12 of 12 positions shown; findings below may reference images not displayed]

FINDINGS: There is homogeneous distribution of injected radiotracer throughout
the hepatic parenchyma.

There is early filling of the gallbladder, initially seen on the 10
min anterior projection planar image.

There is early excretion of injected radiotracer with opacification
of small bowel, initially seen on the provided 50 min anterior
projection planar image.

There is brisk emptying of the gallbladder following the uneventful
administration of CCK.. Gallbladder ejection fraction - 95.8%
(Normal ejection fraction is greater than 30%).

The patient did not experience symptoms during the CCK infusion.
IMPRESSION: No scintigraphic evidence of acute cholecystitis or biliary
dyskinesia. Normal gallbladder ejection fraction.

## 2015-05-09 NOTE — Progress Notes (Signed)
   Subjective:    Patient ID: Tyler Perkins, male    DOB: March 16, 1941, 74 y.o.   MRN: [id number]  HPI Here today for f/u. She was last seen in April of this year. Hx of GERD and colonic adenoma. He tells me he is doing good. He denies any rectal bleeding. He denies acid reflux. Acid reflux controlled with Dexilant. He has lost about 5 pounds since his last visit. He has had family issues with his children. He has a BM about usually daily. No melena or BRRB. Appetite is good for the most part.   History of colonic adenomas. Last colonoscopy was in February 2012 with removal of single small tubular adenoma.  Review of Systems Past Medical History  Diagnosis Date  . Lung nodule   . COPD (chronic obstructive pulmonary disease) ([place])   . Obstructive sleep apnea   . GERD (gastroesophageal reflux disease)   . Mixed hyperlipidemia   . Macular degeneration     Bilateral    Past Surgical History  Procedure Laterality Date  . Cataract extraction    . Lymph node dissection  2007    VATS resection intraparenchymal lymph node  . Deviated septum repair    . Laparoscopic appendectomy    . Retinal detachment repair w/ scleral buckle le      Allergies  Allergen Reactions  . Dilaudid [Hydromorphone Hcl] Hives  . Hydromorphone Hcl Rash    *Dilaudid* - severe rash  . Morphine Other (See Comments)    confusion  . Codeine     Confusion  . Erythromycin     Stomach pain  . Ketorolac     persistent headaches  . Ultram [Tramadol]     confusion  . Lyrica [Pregabalin] Swelling    Current Outpatient Prescriptions on File Prior to Visit  Medication Sig Dispense Refill  . amitriptyline (ELAVIL) 50 MG tablet Take 50 mg by mouth at bedtime.    Marland Kitchen aspirin (BAYER LOW STRENGTH) 81 MG EC tablet Take 81 mg by mouth daily.      . beta carotene w/minerals (OCUVITE) tablet Take 1 tablet by mouth daily.    . clonazePAM (KLONOPIN) 1 MG tablet Take 1 mg by mouth at bedtime.    Marland Kitchen DEXILANT 60 MG capsule  TAKE 1 CAPSULE BY MOUTH EVERY DAY 30 capsule 5  . Multiple Vitamin (MULTIVITAMIN) tablet Take 1 tablet by mouth daily.      . NON FORMULARY CPAP Use as directed    . PARoxetine (PAXIL) 40 MG tablet Take 40 mg by mouth every morning.     No current facility-administered medications on file prior to visit.        Objective:   Physical Exam Blood pressure 132/70, pulse 72, temperature 99.5 F (37.5 C), height 5\' 10"  (1.778 m), weight 183 lb (83.008 kg). Alert and oriented. Skin warm and dry. Oral mucosa is moist.   . Sclera anicteric, conjunctivae is pink. Thyroid not enlarged. No cervical lymphadenopathy. Lungs clear. Heart regular rate and rhythm.  Abdomen is soft. Bowel sounds are positive. No hepatomegaly. No abdominal masses felt. No tenderness.  No edema to lower extremities.          Assessment & Plan:  GERD, controlled at this time with Dexilant Colonic adenoma. #2. History of colonic adenomas. Last colonoscopy was in February 2012 with removal of single small tubular adenoma. Next colonoscopy in February 2017

## 2015-05-09 NOTE — Patient Instructions (Signed)
Recall for colonoscopy in February 2017.

## 2015-05-22 ENCOUNTER — Ambulatory Visit: Payer: Medicare Other | Admitting: Internal Medicine

## 2015-06-10 ENCOUNTER — Other Ambulatory Visit (INDEPENDENT_AMBULATORY_CARE_PROVIDER_SITE_OTHER): Payer: Self-pay | Admitting: Internal Medicine

## 2015-06-20 ENCOUNTER — Encounter (INDEPENDENT_AMBULATORY_CARE_PROVIDER_SITE_OTHER): Payer: Self-pay | Admitting: *Deleted

## 2015-08-03 ENCOUNTER — Encounter: Payer: Self-pay | Admitting: Internal Medicine

## 2015-08-03 ENCOUNTER — Ambulatory Visit (INDEPENDENT_AMBULATORY_CARE_PROVIDER_SITE_OTHER): Payer: Medicare Other | Admitting: Internal Medicine

## 2015-08-03 ENCOUNTER — Ambulatory Visit (INDEPENDENT_AMBULATORY_CARE_PROVIDER_SITE_OTHER)
Admission: RE | Admit: 2015-08-03 | Discharge: 2015-08-03 | Disposition: A | Discharge status: Home / Self Care | Payer: Medicare Other | Source: Ambulatory Visit | Attending: Internal Medicine | Admitting: Internal Medicine

## 2015-08-03 VITALS — BP 116/76 | HR 101 | Ht 70.0 in | Wt 188.2 lb

## 2015-08-03 DIAGNOSIS — R911 Solitary pulmonary nodule: Secondary | ICD-10-CM | POA: Diagnosis not present

## 2015-08-03 DIAGNOSIS — G4733 Obstructive sleep apnea (adult) (pediatric): Secondary | ICD-10-CM

## 2015-08-03 DIAGNOSIS — J449 Chronic obstructive pulmonary disease, unspecified: Secondary | ICD-10-CM

## 2015-08-03 NOTE — Progress Notes (Signed)
Subjective:    Patient ID: Tyler Perkins, male    DOB: 1941/06/01, 74 y.o.   MRN: [id number]  HPI 02/25/11- 46 yoM former smoker (45 pack years) followed for COPD, hx lung nodule, OSA,complicated by chronic back pain.     Wife here    PCP Dr Woody Seller Last here February 23, 2010 He has been concerned about exertional dyspnea. Quit smoking 3 years ago. Seen in June for cardiology assessment by Dr Domenic Polite because of exertional dyspnea. Told stress test didn't show basis to justify cath, and that he should walk more aggressively. He is trying to do that, and says exercise tolerance is getting a little better. Scheduled for ablation procedure for his back pain. Has lost 10 lbs. CPAP 14- long term autoPAP 8-20. Able to use it all night every night. Prevents snore.   03/02/12-69 yoM former smoker (45 pack years) followed for COPD, hx lung nodule, OSA,complicated by chronic back pain.   Pt state wearing CPAP nightly approx 6 hrs. Pt states that he has a habit with pulling it off in the middle of the night. Pt denies any issue with mask or pressure.    Wife here CPAP autoPAP/ [employer]. He still pulls off mask occasionally in his sleep, usually in the last hour or so before waking. Has had injections for back pain which may give systemic steroid effect helpful to his breathing. CXR 02/26/11- IMPRESSION:  Stable basilar scarring. No active lung disease.  Original Report Authenticated By: Joretta Bachelor, M.D.   03/05/13-69 yoM former smoker (45 pack years) followed for COPD, hx lung nodule, OSA,complicated by chronic back pain. FOLLOWS FOR:Pt reports he is wearing CPAP/ Auto/ [facility] everynight approx 5-6 hrs a night. He wakes up 3-4 AM and finds the mask off his face. he is not sure why he is taking off while sleeping.  He reports he is still SOB w/ activity, especially if he lifts, pushes or uses his arms..  Had stress test and good report from cardiology. Dry cough since April. Treats GERD  with Nexi CXR 03/04/13  IMPRESSION:  1. Stable basilar scarring.  2. No acute findings.  Original Report Authenticated By: Angelita Ingles, M.D.  05/11/13- 5 yoM former smoker (45 pack years) followed for COPD, hx lung nodule, OSA,complicated by chronic back pain. Wants to wait until November for flu shot FOLLOWS FOR:Had pft & 20min walk in Sept.-wears CPAP 5-6 hrs. at night,,wakes up with mask off, new mask is good. Pulls CPAP off in his sleep but likes this mask and continues AutoPAP 8-20/ [employer]. Much stress-mother died and he is involved in legal matters against wife in separation. PFT 04/12/2013-normal spirometry flows, insignificant response to bronchodilator, normal lung volumes, diffusion mildly reduced. FVC 3.90/93%, FEV1 2.9/95%, FEV1/FVC 0.75, TLC 81%, DLCO 62%. 6 MWT- 04/12/13- 96%, 94%, 99%, 513 m. No oxygen limitation on this exercise  05/16/14- 73 yoM former smoker (45 pack years) followed for COPD, hx lung nodule, OSA,complicated by chronic back pain FOLLOW FOR:  COPD; OSA; Pulling CPAP(auto/ [facility]) mask off every night, it fits well, but he doesn't know why he is pulling it off every night; no other complaints  08/03/2015-74 year old male former smoker (45 pack years) followed for COPD, history lung nodule, OSA, complicated by chronic back pain CPAP auto/[facility] FOLLOWS FOR: DME is [facility]; pt is due for new CPAP and needs order to [facility] for machine. Pt states he has not been using CPAP lately due to personal isues going  on.  Pt states his breathing is going well unless he exerts himself too much-then gets SOB. [place] CP[place] while moving into temporary housing here but plans to restart. Machine is more than 74 years old. Discussed replacement. Stable dyspnea on exertion with heavy exertion only. Not much cough or wheeze.  ROS-see HPI Constitutional:   No-   weight loss, night sweats, fevers, chills, fatigue,  lassitude. HEENT:   No-  headaches, difficulty swallowing, tooth/dental problems, sore throat,       No-  sneezing, itching, ear ache, nasal congestion, post nasal drip,  CV:  No-   chest pain, orthopnea, PND, swelling in lower extremities, anasarca, dizziness, palpitations Resp: + shortness of breath with exertion or at rest.              No-   productive cough,  + non-productive cough,  No- coughing up of blood.              No-   change in color of mucus.  No- wheezing.   Skin: No-   rash or lesions. GI:  + heartburn, indigestion, no-abdominal pain, nausea, vomiting, GU:  MS:  No-   joint pain or swelling.  + Chronic back pain Neuro-     nothing unusual Psych:  No- change in mood or affect. No depression or anxiety.  No memory loss.    Objective:   Physical Exam General- Alert, Oriented, Affect-appropriate, Distress- none acute  Medium build, very relaxed Skin- rash-none, lesions- none, excoriation- none Lymphadenopathy- none Head- atraumatic            Eyes- Gross vision intact, PERRLA, conjunctivae clear secretions            Ears- +hearing aids            Nose- Clear, no-Septal dev, mucus, polyps, erosion, perforation             Throat- Mallampati IV , mucosa clear , drainage- none, tonsils- atrophic,+ dentures Neck- flexible , trachea midline, no stridor , thyroid nl, carotid no bruit Chest - symmetrical excursion , unlabored           Heart/CV- RRR , no murmur , no gallop  , no rub, nl s1 s2                           - JVD- none , edema- none, stasis changes- none, varices- none           Lung- clear to P&A, wheeze- none, cough- none , dullness-none, rub- none           Chest wall-  Abd-  Br/ Gen/ Rectal- Not done, not indicated Extrem- cyanosis- none, clubbing, none, atrophy- none, strength- nl Neuro- grossly intact to observation   Assessment & Plan:

## 2015-08-03 NOTE — Patient Instructions (Signed)
Order- CXR   Dx lung nodule  Order- DME [facility]) replacement for old CPAP machine, auto 5-20, mask of choice, humidifier, supplies, AirView   Dx OSA

## 2015-08-06 NOTE — Assessment & Plan Note (Signed)
We discussed compliance. He is motivated to resume using his machine and his fiance tells him he he snores without it. Plan-order replacement CPAP machine

## 2015-08-06 NOTE — Assessment & Plan Note (Signed)
Probably benign Plan-update chest x-ray

## 2015-08-06 NOTE — Assessment & Plan Note (Signed)
Little apparent change clinically. Plan-chest x-ray

## 2015-08-08 ENCOUNTER — Telehealth: Payer: Self-pay | Admitting: Internal Medicine

## 2015-08-08 NOTE — Telephone Encounter (Signed)
Left message for Melissa at [facility] to return call.

## 2015-08-09 ENCOUNTER — Telehealth (INDEPENDENT_AMBULATORY_CARE_PROVIDER_SITE_OTHER): Payer: Self-pay | Admitting: Internal Medicine

## 2015-08-09 NOTE — Telephone Encounter (Signed)
Spoke with Melissa at [facility], states that CY documented in last ov that pt was not using cpap-because of this documentation, Medicare will not cover a replacement cpap.  Lenna Sciara has suggested that we reach out to the pt to have him wear his cpap for a month or two, and after pt is seen again with documentation proving he is using the cpap his insurance should cover his cpap.  CY are you ok with this course of action?  If so, when do you want to see the pt back?  Thanks!

## 2015-08-09 NOTE — Telephone Encounter (Signed)
ATC the pt, NA and no option to leave msg

## 2015-08-09 NOTE — Telephone Encounter (Signed)
Yes- good course of action.  Please offer him return ov in 2 months. Talk with Joellen Jersey to find a spot on schedule.

## 2015-08-09 NOTE — Telephone Encounter (Signed)
I will address this with Dr.Rehman. 

## 2015-08-09 NOTE — Telephone Encounter (Signed)
Tyler Perkins from [facility] called saying Tyler Perkins "gets in a donut hole with Dexalent." She's wondering if we can prescribe the generic version of Nexium for him. She'd like a phone call regarding this.  Tyler Perkins's ph# [phone] Thank you.

## 2015-08-09 NOTE — Telephone Encounter (Signed)
LMTCB for Tyler Perkins  

## 2015-08-10 NOTE — Telephone Encounter (Signed)
Spoke with pt. He will use his current CPAP machine for the next few months. 6 week ROV has been scheduled with TP to check compliance. Nothing further was needed at this time.

## 2015-08-10 NOTE — Telephone Encounter (Signed)
Per Dr.Rehman may call in generic Nexium 40 mg - Take 1 by mouth daily 5 refills. This was called to [facility].

## 2015-09-18 ENCOUNTER — Other Ambulatory Visit (INDEPENDENT_AMBULATORY_CARE_PROVIDER_SITE_OTHER): Payer: Self-pay | Admitting: *Deleted

## 2015-09-18 ENCOUNTER — Encounter (INDEPENDENT_AMBULATORY_CARE_PROVIDER_SITE_OTHER): Payer: Self-pay | Admitting: *Deleted

## 2015-09-18 MED ORDER — PEG 3350-KCL-NA BICARB-NACL 420 G PO SOLR: 4000.0000 mL | Freq: Once | ORAL | Status: DC

## 2015-09-18 NOTE — Telephone Encounter (Signed)
Patient needs trilyte 

## 2015-09-19 ENCOUNTER — Other Ambulatory Visit (INDEPENDENT_AMBULATORY_CARE_PROVIDER_SITE_OTHER): Payer: Self-pay | Admitting: *Deleted

## 2015-09-19 DIAGNOSIS — Z86010 Personal history of colonic polyps: Secondary | ICD-10-CM

## 2015-10-04 ENCOUNTER — Telehealth (INDEPENDENT_AMBULATORY_CARE_PROVIDER_SITE_OTHER): Payer: Self-pay | Admitting: *Deleted

## 2015-10-04 NOTE — Telephone Encounter (Signed)
agree

## 2015-10-04 NOTE — Telephone Encounter (Signed)
Referring MD/PCP: burdine   Procedure: tcs  Reason/Indication:  Hx polyps  Has patient had this procedure before?  Yes, 2012 -- paper chart  If so, when, by whom and where?    Is there a family history of colon cancer?  no  Who?  What age when diagnosed?    Is patient diabetic?   no      Does patient have prosthetic heart valve or mechanical valve?  no  Do you have a pacemaker?  no  Has patient ever had endocarditis? no  Has patient had joint replacement within last 12 months?  no  Does patient tend to be constipated or take laxatives? no  Does patient have a history of alcohol/drug use?  no  Is patient on Coumadin, Plavix and/or Aspirin? yes  Medications: asa 81 mg daily, esomepra mag 40 mg daily, atorvastatin 40 mg daily, paroxetine 40 mg daily, fenofibrate 160 mg daily, amitriptyline 50 mg daily, clonazepam 1 mg nightly  Allergies: see epic  Medication Adjustment: asa 2 days  Procedure date & time: 11/01/15 at 1030

## 2015-10-23 ENCOUNTER — Ambulatory Visit (INDEPENDENT_AMBULATORY_CARE_PROVIDER_SITE_OTHER): Payer: Medicare Other | Admitting: Adult Health

## 2015-10-23 ENCOUNTER — Encounter: Payer: Self-pay | Admitting: Adult Health

## 2015-10-23 VITALS — BP 110/62 | HR 107 | Temp 99.3°F | Ht 70.0 in | Wt 187.0 lb

## 2015-10-23 DIAGNOSIS — G4733 Obstructive sleep apnea (adult) (pediatric): Secondary | ICD-10-CM | POA: Diagnosis not present

## 2015-10-23 NOTE — Addendum Note (Signed)
Addended by: Osa Craver on: 10/23/2015 02:57 PM   Modules accepted: Orders

## 2015-10-23 NOTE — Assessment & Plan Note (Addendum)
New order to [facility] for new CPAP machine.  Continue on CPAP At bedtime   Wear at least 4-6 hr each night .  Download in 6 weeks after your new machine is started.  Follow up with  Dr. Annamaria Boots  In Dec 2017 .

## 2015-10-23 NOTE — Patient Instructions (Addendum)
New order to [facility] for new CPAP machine.  Continue on CPAP At bedtime   Wear at least 4-6 hr each night .  Download in 6 weeks after your new machine is started.  Follow up with  Dr. Annamaria Boots  In Dec 2017 .

## 2015-10-23 NOTE — Progress Notes (Signed)
Subjective:    Patient ID: Tyler Perkins, male    DOB: 09/04/1940, 75 y.o.   MRN: [id number]  HPI 75 yo male with former smoker with COPD , lung nodule and OSA   10/23/2015 Follow up : OSA  Pt returns for 3 month follow up  .  Uses CPAP on average 3-4 hours nightly. Needs new order for CPAPmachine.  Download shows excellent compliance . Avg usage 7 hr , AHI 6.6., + leaks.  Says he wants new machine , his is old. Got new mask but lots of leaks.  So loud it is interferring with his sleep. Snoring is keeping his wife up.  Recently got remarried.  Does not feel as rested . Moving to [place].   CXR in Dec 2016 with chronic basilar scarring.   Past Medical History  Diagnosis Date  . Lung nodule   . COPD (chronic obstructive pulmonary disease) ([place])   . Obstructive sleep apnea   . GERD (gastroesophageal reflux disease)   . Mixed hyperlipidemia   . Macular degeneration     Bilateral   Current Outpatient Prescriptions on File Prior to Visit  Medication Sig Dispense Refill  . amitriptyline (ELAVIL) 50 MG tablet Take 50 mg by mouth at bedtime.    Marland Kitchen aspirin (BAYER LOW STRENGTH) 81 MG EC tablet Take 81 mg by mouth daily.      Marland Kitchen atorvastatin (LIPITOR) 40 MG tablet Take 40 mg by mouth daily.    . beta carotene w/minerals (OCUVITE) tablet Take 1 tablet by mouth daily.    . clonazePAM (KLONOPIN) 1 MG tablet Take 1 mg by mouth at bedtime.    Marland Kitchen DEXILANT 60 MG capsule TAKE 1 CAPSULE BY MOUTH EVERY DAY 30 capsule 5  . fenofibrate 160 MG tablet Take 160 mg by mouth daily.    . Magnesium 400 MG TABS Take 1,200 mg by mouth daily.    . Multiple Vitamin (MULTIVITAMIN) tablet Take 1 tablet by mouth daily.      . NON FORMULARY CPAP Use as directed    . PARoxetine (PAXIL) 40 MG tablet Take 40 mg by mouth every morning.    . travoprost, benzalkonium, (TRAVATAN) 0.004 % ophthalmic solution 1 drop at bedtime.     No current facility-administered medications on file prior to visit.     Review of  Systems Constitutional:   No  weight loss, night sweats,  Fevers, chills,  +fatigue, or  lassitude.  HEENT:   No headaches,  Difficulty swallowing,  Tooth/dental problems, or  Sore throat,                No sneezing, itching, ear ache, nasal congestion, post nasal drip,   CV:  No chest pain,  Orthopnea, PND, swelling in lower extremities, anasarca, dizziness, palpitations, syncope.   GI  No heartburn, indigestion, abdominal pain, nausea, vomiting, diarrhea, change in bowel habits, loss of appetite, bloody stools.   Resp: No shortness of breath with exertion or at rest.  No excess mucus, no productive cough,  No non-productive cough,  No coughing up of blood.  No change in color of mucus.  No wheezing.  No chest wall deformity  Skin: no rash or lesions.  GU: no dysuria, change in color of urine, no urgency or frequency.  No flank pain, no hematuria   MS:  No joint pain or swelling.  No decreased range of motion.  No back pain.  Psych:  No change in mood or affect. No depression  or anxiety.  No memory loss.         Objective:   Physical Exam  Filed Vitals:   10/23/15 1414  BP: 110/62  Pulse: 107  Temp: 99.3 F (37.4 C)  TempSrc: Oral  Height: 5\' 10"  (1.778 m)  Weight: 187 lb (84.823 kg)  SpO2: 96%   GEN: A/Ox3; pleasant , NAD, eldelry   HEENT:  [place]/AT,  EACs-clear, TMs-wnl, NOSE-clear, THROAT-clear, no lesions, no postnasal drip or exudate noted. Class 2-3 MP airway , dentures   NECK:  Supple w/ fair ROM; no JVD; normal carotid impulses w/o bruits; no thyromegaly or nodules palpated; no lymphadenopathy.  RESP  Clear  P & A; w/o, wheezes/ rales/ or rhonchi.no accessory muscle use, no dullness to percussion  CARD:  RRR, no m/r/g  , no peripheral edema, pulses intact, no cyanosis or clubbing.  GI:   Soft & nt; nml bowel sounds; no organomegaly or masses detected.  Musco: Warm bil, no deformities or joint swelling noted.   Neuro: alert, no focal deficits noted.     Skin: Warm, no lesions or rashes  Tammy Parrett NP-C  [facility]  10/23/15      Assessment & Plan:

## 2015-10-31 ENCOUNTER — Telehealth: Payer: Self-pay | Admitting: Internal Medicine

## 2015-10-31 NOTE — Telephone Encounter (Signed)
Called spoke with pt. He reports he received a new CPAP yesterday and was advised by Medicare he needed a 90 day follow up with Dr. Annamaria Boots. Pt is moving to [place] this weekend but will be back here in the 01/03/16. He is scheduled to see TP for the follow up on 5/31 at 11:15. Nothing further needed

## 2015-11-01 ENCOUNTER — Ambulatory Visit (HOSPITAL_COMMUNITY): Admission: RE | Admit: 2015-11-01 | Payer: Medicare Other | Source: Ambulatory Visit | Admitting: Internal Medicine

## 2015-11-01 ENCOUNTER — Encounter (HOSPITAL_COMMUNITY): Admission: RE | Payer: Self-pay | Source: Ambulatory Visit

## 2015-11-01 SURGERY — COLONOSCOPY
Anesthesia: Moderate Sedation

## 2015-11-09 ENCOUNTER — Encounter: Payer: Self-pay | Admitting: Adult Health

## 2015-12-15 ENCOUNTER — Telehealth: Payer: Self-pay | Admitting: Adult Health

## 2015-12-15 NOTE — Telephone Encounter (Signed)
Per verbal order from TP after reviewing CPAP download from 10/30/15-11/28/15  Not as well controlled Question did he receive a new mask Positive for leaks Change to auto set 5-15 Download in 6 week  LVM for pt to return call.

## 2015-12-15 NOTE — Telephone Encounter (Signed)
Yes definitely change him to autoset 5-15 cm H20 to see if different pressure is needed.  AHI was 16.  Keep ov end of May .

## 2015-12-15 NOTE — Telephone Encounter (Signed)
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  Pt denies any mask leaking since getting new mask.  Pt wants to know if pressure change is necessary at this time or if it can wait until his Jan 03, 2016 appt with TP?  Patient states that he is going to bring his machine to the  appt for [place] to get a download. Pt wants to see how his mask holds up and if his sleep apnea is better controlled with the new mask before making changes. Please advise TP. Thanks.

## 2015-12-15 NOTE — Telephone Encounter (Signed)
LMTCB

## 2015-12-20 ENCOUNTER — Encounter: Payer: Self-pay | Admitting: Adult Health

## 2016-01-02 ENCOUNTER — Encounter: Payer: Self-pay | Admitting: Cardiology

## 2016-01-02 ENCOUNTER — Ambulatory Visit (INDEPENDENT_AMBULATORY_CARE_PROVIDER_SITE_OTHER): Payer: Medicare Other | Admitting: Cardiology

## 2016-01-02 VITALS — BP 130/77 | HR 93 | Ht 70.0 in | Wt 189.8 lb

## 2016-01-02 DIAGNOSIS — E7800 Pure hypercholesterolemia, unspecified: Secondary | ICD-10-CM

## 2016-01-02 DIAGNOSIS — R0609 Other forms of dyspnea: Secondary | ICD-10-CM

## 2016-01-02 DIAGNOSIS — Z136 Encounter for screening for cardiovascular disorders: Secondary | ICD-10-CM | POA: Diagnosis not present

## 2016-01-02 NOTE — Progress Notes (Signed)
Cardiology Office Note  Date: 01/02/2016   ID: Tyler Perkins, DOB 1941/05/23, MRN [id number]  PCP: Curlene Labrum, MD  Primary Cardiologist: Rozann Lesches, MD   Chief Complaint  Patient presents with  . Cardiac follow-up    History of Present Illness: Tyler Perkins is a 75 y.o. male last seen in May 2016. He presents for a routine follow-up visit. States that in the interim he got married and is now living in [place] with his wife. He is establishing with health care providers in that region.  We have followed him for years with a history of dyspnea on exertion, no clearly documented obstructive CAD based on prior noninvasive cardiac imaging. He has been managed medically in terms of risk factor reduction, on aspirin and Lipitor most recently.  Pulmonary visit noted from March for evaluation of OSA, also history of COPD. this has also contributed to his symptoms.  ECG in the office today shows sinus rhythm with R' lead V1 and rightward axis.  Past Medical History  Diagnosis Date  . Lung nodule   . COPD (chronic obstructive pulmonary disease) ([place])   . Obstructive sleep apnea   . GERD (gastroesophageal reflux disease)   . Mixed hyperlipidemia   . Macular degeneration     Bilateral    Past Surgical History  Procedure Laterality Date  . Cataract extraction    . Lymph node dissection  2007    VATS resection intraparenchymal lymph node  . Deviated septum repair    . Laparoscopic appendectomy    . Retinal detachment repair w/ scleral buckle le      Current Outpatient Prescriptions  Medication Sig Dispense Refill  . amitriptyline (ELAVIL) 50 MG tablet Take 50 mg by mouth at bedtime.    Marland Kitchen aspirin (BAYER LOW STRENGTH) 81 MG EC tablet Take 81 mg by mouth daily.      Marland Kitchen atorvastatin (LIPITOR) 40 MG tablet Take 40 mg by mouth daily.    . beta carotene w/minerals (OCUVITE) tablet Take 1 tablet by mouth daily.    . clonazePAM (KLONOPIN) 1 MG tablet Take 1 mg by mouth at  bedtime.    Marland Kitchen esomeprazole (NEXIUM) 40 MG capsule Take 1 capsule by mouth daily.    . fenofibrate 160 MG tablet Take 160 mg by mouth daily.    . fluticasone (FLONASE) 50 MCG/ACT nasal spray as needed.    . Magnesium 400 MG TABS Take 1,200 mg by mouth daily.    . meclizine (ANTIVERT) 25 MG tablet Take 25 mg by mouth as needed for dizziness.    . Multiple Vitamin (MULTIVITAMIN) tablet Take 1 tablet by mouth daily.      . NON FORMULARY CPAP Use as directed    . PARoxetine (PAXIL) 40 MG tablet Take 40 mg by mouth every morning.    . travoprost, benzalkonium, (TRAVATAN) 0.004 % ophthalmic solution 1 drop at bedtime.     No current facility-administered medications for this visit.   Allergies:  Dilaudid; Hydromorphone hcl; Morphine; Codeine; Erythromycin; Ketorolac; Ultram; and Lyrica   Social History: The patient  reports that he quit smoking about 8 years ago. His smoking use included Cigarettes. He has a 30 pack-year smoking history. He has never used smokeless tobacco. He reports that he does not drink alcohol or use illicit drugs.   ROS:  Please see the history of present illness. Otherwise, complete review of systems is positive for NYHA class II dyspnea, no exertional chest pain.  All other  systems are reviewed and negative.   Physical Exam: VS:  BP 130/77 mmHg  Pulse 93  Ht 5\' 10"  (1.778 m)  Wt 189 lb 12.8 oz (86.093 kg)  BMI 27.23 kg/m2  SpO2 95%, BMI Body mass index is 27.23 kg/(m^2).  Wt Readings from Last 3 Encounters:  01/02/16 189 lb 12.8 oz (86.093 kg)  10/23/15 187 lb (84.823 kg)  08/03/15 188 lb 3.2 oz (85.367 kg)    Overweight male, appears comfortable at rest. HEENT: Conjunctiva and lids normal, oropharynx with moist mucosa.  Neck: Supple, no elevated JVP or significant carotid bruit.  Lungs: Diminished but clear breath sounds, nonlabored.  Cardiac: Regular rate and rhythm, no significant murmur or gallop.  Abdomen: Soft, nontender, bowel sounds present.   Extremities: No pitting edema, distal pulses full.  ECG: I personally reviewed the prior tracing from 06/08/2014 which sinus rhythm with vertical axis.  Recent Labwork:  January 2016: Hemoglobin 14.0, platelets 355  Other Studies Reviewed Today:  Exercise Cardiolite 2014: Below average exercise tolerance but no ischemic changes by ECG or perfusion imaging, LVEF 65%. He achieved adequate heart rate response, 7 METS in stage 2.   Echocardiogram December 2013 [facility]): LVEF of 55% with grade 1 diastolic dysfunction, trace tricuspid regurgitation, no pericardial effusion.  Chest x-ray 08/03/2015: FINDINGS: The cardiac silhouette, mediastinal and hilar contours are within normal limits and stable. There is mild tortuosity and calcification of the thoracic aorta. There are chronic scarring changes involving both lung bases. No definite acute pulmonary findings. No pleural effusion. The bony thorax is intact.  IMPRESSION: Chronic basilar scarring changes. No acute overlying pulmonary process.  Assessment and Plan:  1. Long-standing history of dyspnea on exertion, likely multifactorial with pulmonary contributors. He does not have any clearly documented obstructive CAD based on previous noninvasive imaging. No exertional angina reported this time, and ECG overall nonspecific. Recommend continued risk factor modification, he remains on aspirin and Lipitor. He is establishing with a new PCP in [place]. We can see him back as needed.  2. Hyperlipidemia, on Lipitor. Lab work is been followed by Dr. Pleas Koch.  Current medicines were reviewed with the patient today.   Orders Placed This Encounter  Procedures  . EKG 12-Lead    Disposition: FU as needed.   Signed, Satira Sark, MD, [facility] 01/02/2016 4:45 PM    [facility] [place], [place], [place] [address] Phone: [phone]; Fax: [phone]

## 2016-01-02 NOTE — Patient Instructions (Signed)
Your physician recommends that you continue on your current medications as directed. Please refer to the Current Medication list given to you today.  Your physician recommends that you schedule a follow-up appointment in: as needed  

## 2016-01-03 ENCOUNTER — Ambulatory Visit (INDEPENDENT_AMBULATORY_CARE_PROVIDER_SITE_OTHER): Payer: Medicare Other | Admitting: Adult Health

## 2016-01-03 ENCOUNTER — Encounter: Payer: Self-pay | Admitting: Adult Health

## 2016-01-03 VITALS — BP 148/74 | HR 98 | Temp 98.1°F | Ht 70.0 in | Wt 189.0 lb

## 2016-01-03 DIAGNOSIS — J449 Chronic obstructive pulmonary disease, unspecified: Secondary | ICD-10-CM

## 2016-01-03 DIAGNOSIS — G4733 Obstructive sleep apnea (adult) (pediatric): Secondary | ICD-10-CM | POA: Diagnosis not present

## 2016-01-03 NOTE — Patient Instructions (Addendum)
Please set CPAP to auto set 5-15cmH20  Download in 1 month .  Please set up with pulmonary specialist /sleep specialist in [place].  Continue on CPAP At bedtime   Wear at least 4-6 hr each night .  Go by [facility] for adjustments tomorrow as discussed.  Good luck on your move.

## 2016-01-03 NOTE — Assessment & Plan Note (Signed)
Good usage on new machine  Not optimal control   Plan  Please set CPAP to auto set 5-15cmH20  Download in 1 month .  Please set up with pulmonary specialist /sleep specialist in [place].  Continue on CPAP At bedtime   Wear at least 4-6 hr each night .  Go by [facility] for adjustments tomorrow as discussed.  Good luck on your move.

## 2016-01-03 NOTE — Addendum Note (Signed)
Addended by: Osa Craver on: 01/03/2016 12:33 PM   Modules accepted: Orders

## 2016-01-03 NOTE — Progress Notes (Signed)
Subjective:    Patient ID: Tyler Perkins, male    DOB: 23-Jun-1941, 75 y.o.   MRN: [id number]  HPI 75 yo male with former smoker with COPD , lung nodule and OSA   TEST  NPSG  08/09/02- AHI 5/hr= upper limit of normal PFT 04/12/2013-normal spirometry flows, insignificant response to bronchodilator, normal lung volumes, diffusion mildly reduced. FVC 3.90/93%, FEV1 2.9/95%, FEV1/FVC 0.75, TLC 81%, DLCO 62%. 6 MWT- 04/12/13- 96%, 94%, 99%, 513 m. No oxygen limitation on this exercise  01/03/2016 Follow up : OSA  Pt returns for 2 month follow up  .  Uses CPAP on average 4-6 hours nightly. Recently started on new CPAP machine. Says he likes it better but still has trouble going to sleep . Uses klonopin with some help. Recently moved to [place] after getting married. Lots of changes and working hard on moving Download over last 30days shows excellent compliance with 100% usage. avg usage at [place] . On set pressure at [place]. AHI 15.6 . ++leaks. We discussed mask fitting and head gear adjustment.  He is going to go by DME today.  Does still feel tired during daytime. Marland Kitchen   Pt chart has mention of lung nodule dating years back . Serial CXR in Dec 2016 with chronic basilar scarring.  Chart review shows cxr dating back to 2003 with chronic basilar scarring without changes.  Denies hemoptysis , wt loss.   Previous PFT with no sign airflow obstruction . Former smoker . Not on maintenance medication  Denies cough or wheezing . No chest pain, orthopnea, or edema.    Past Medical History  Diagnosis Date  . Lung nodule   . COPD (chronic obstructive pulmonary disease) ([place])   . Obstructive sleep apnea   . GERD (gastroesophageal reflux disease)   . Mixed hyperlipidemia   . Macular degeneration     Bilateral   Current Outpatient Prescriptions on File Prior to Visit  Medication Sig Dispense Refill  . amitriptyline (ELAVIL) 50 MG tablet Take 50 mg by mouth at bedtime.    Marland Kitchen aspirin (BAYER LOW STRENGTH)  81 MG EC tablet Take 81 mg by mouth daily.      Marland Kitchen atorvastatin (LIPITOR) 40 MG tablet Take 40 mg by mouth daily.    . beta carotene w/minerals (OCUVITE) tablet Take 1 tablet by mouth daily.    . clonazePAM (KLONOPIN) 1 MG tablet Take 1 mg by mouth at bedtime.    Marland Kitchen esomeprazole (NEXIUM) 40 MG capsule Take 1 capsule by mouth daily.    . fenofibrate 160 MG tablet Take 160 mg by mouth daily.    . fluticasone (FLONASE) 50 MCG/ACT nasal spray as needed.    . Magnesium 400 MG TABS Take 1,200 mg by mouth daily.    . meclizine (ANTIVERT) 25 MG tablet Take 25 mg by mouth as needed for dizziness.    . Multiple Vitamin (MULTIVITAMIN) tablet Take 1 tablet by mouth daily.      . NON FORMULARY CPAP Use as directed    . PARoxetine (PAXIL) 40 MG tablet Take 40 mg by mouth every morning.    . travoprost, benzalkonium, (TRAVATAN) 0.004 % ophthalmic solution 1 drop at bedtime.     No current facility-administered medications on file prior to visit.     Review of Systems Constitutional:   No  weight loss, night sweats,  Fevers, chills,  +fatigue, or  lassitude.  HEENT:   No headaches,  Difficulty swallowing,  Tooth/dental problems, or  Sore  throat,                No sneezing, itching, ear ache, nasal congestion, post nasal drip,   CV:  No chest pain,  Orthopnea, PND, swelling in lower extremities, anasarca, dizziness, palpitations, syncope.   GI  No heartburn, indigestion, abdominal pain, nausea, vomiting, diarrhea, change in bowel habits, loss of appetite, bloody stools.   Resp: No shortness of breath with exertion or at rest.  No excess mucus, no productive cough,  No non-productive cough,  No coughing up of blood.  No change in color of mucus.  No wheezing.  No chest wall deformity  Skin: no rash or lesions.  GU: no dysuria, change in color of urine, no urgency or frequency.  No flank pain, no hematuria   MS:  No joint pain or swelling.  No decreased range of motion.  No back pain.  Psych:  No  change in mood or affect. No depression or anxiety.  No memory loss.         Objective:   Physical Exam  Filed Vitals:   01/03/16 1123  BP: 148/74  Pulse: 98  Temp: 98.1 F (36.7 C)  TempSrc: Oral  Height: 5\' 10"  (1.778 m)  Weight: 189 lb (85.73 kg)  SpO2: 95%   GEN: A/Ox3; pleasant , NAD, eldelry   HEENT:  [place]/AT,  EACs-clear, TMs-wnl, NOSE-clear, THROAT-clear, no lesions, no postnasal drip or exudate noted. Class 2-3 MP airway , dentures   NECK:  Supple w/ fair ROM; no JVD; normal carotid impulses w/o bruits; no thyromegaly or nodules palpated; no lymphadenopathy.  RESP  Clear  P & A; w/o, wheezes/ rales/ or rhonchi.no accessory muscle use, no dullness to percussion  CARD:  RRR, no m/r/g  , no peripheral edema, pulses intact, no cyanosis or clubbing.  GI:   Soft & nt; nml bowel sounds; no organomegaly or masses detected.  Musco: Warm bil, no deformities or joint swelling noted.   Neuro: alert, no focal deficits noted.    Skin: Warm, no lesions or rashes  Laiana Fratus NP-C  [facility]  10/23/15      Assessment & Plan:

## 2016-01-03 NOTE — Assessment & Plan Note (Signed)
Asymptomatic . Not requiring medication  Cont to follow

## 2016-01-12 ENCOUNTER — Encounter: Payer: Self-pay | Admitting: Adult Health

## 2016-01-31 ENCOUNTER — Other Ambulatory Visit (INDEPENDENT_AMBULATORY_CARE_PROVIDER_SITE_OTHER): Payer: Self-pay | Admitting: Internal Medicine

## 2016-07-20 ENCOUNTER — Other Ambulatory Visit (INDEPENDENT_AMBULATORY_CARE_PROVIDER_SITE_OTHER): Payer: Self-pay | Admitting: Internal Medicine

## 2016-08-02 ENCOUNTER — Ambulatory Visit: Payer: Medicare Other | Admitting: Internal Medicine

## 2016-08-06 ENCOUNTER — Ambulatory Visit: Payer: Medicare Other | Admitting: Internal Medicine

## 2017-04-15 IMAGING — DX DG CHEST 2V
2 series · 2 of 2 positions shown · non-contrast
Comparison: 03/02/2012.

CLINICAL DATA: Followup nodule.  Ex-smoker.

EXAM:
CHEST  2 VIEW

[chest pa]
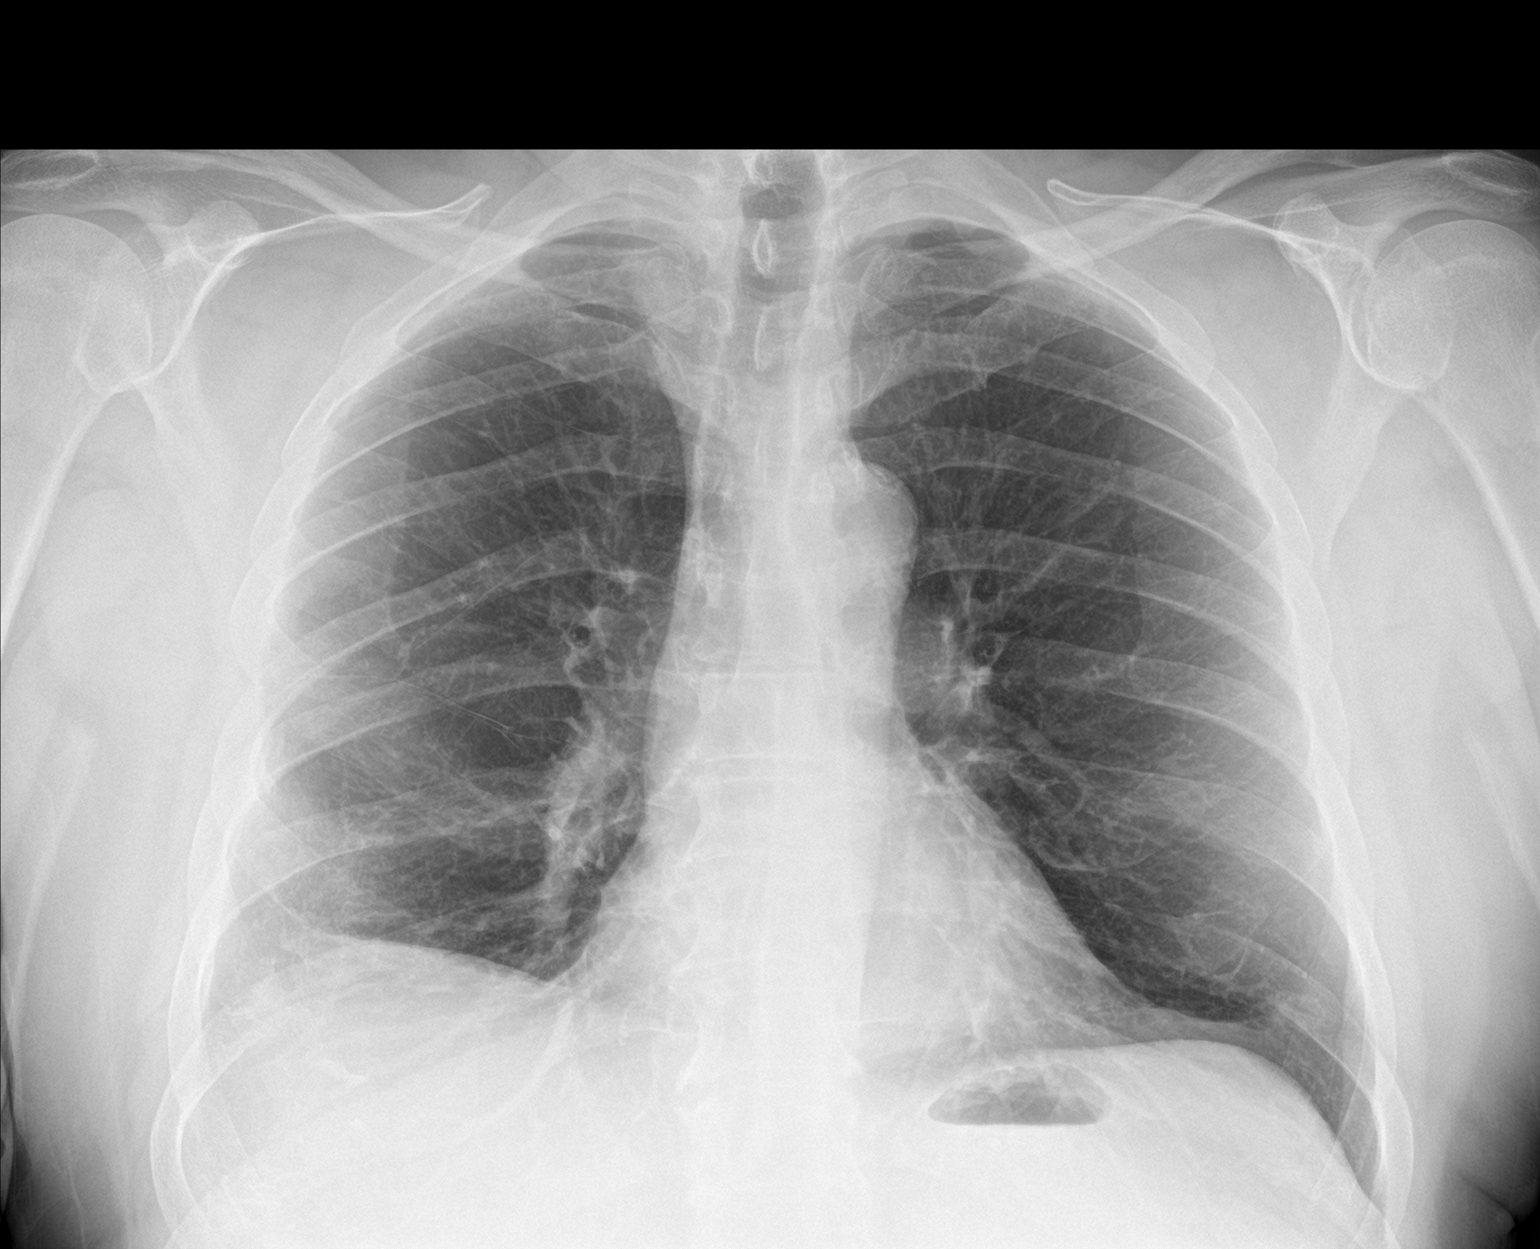

[chest lat]
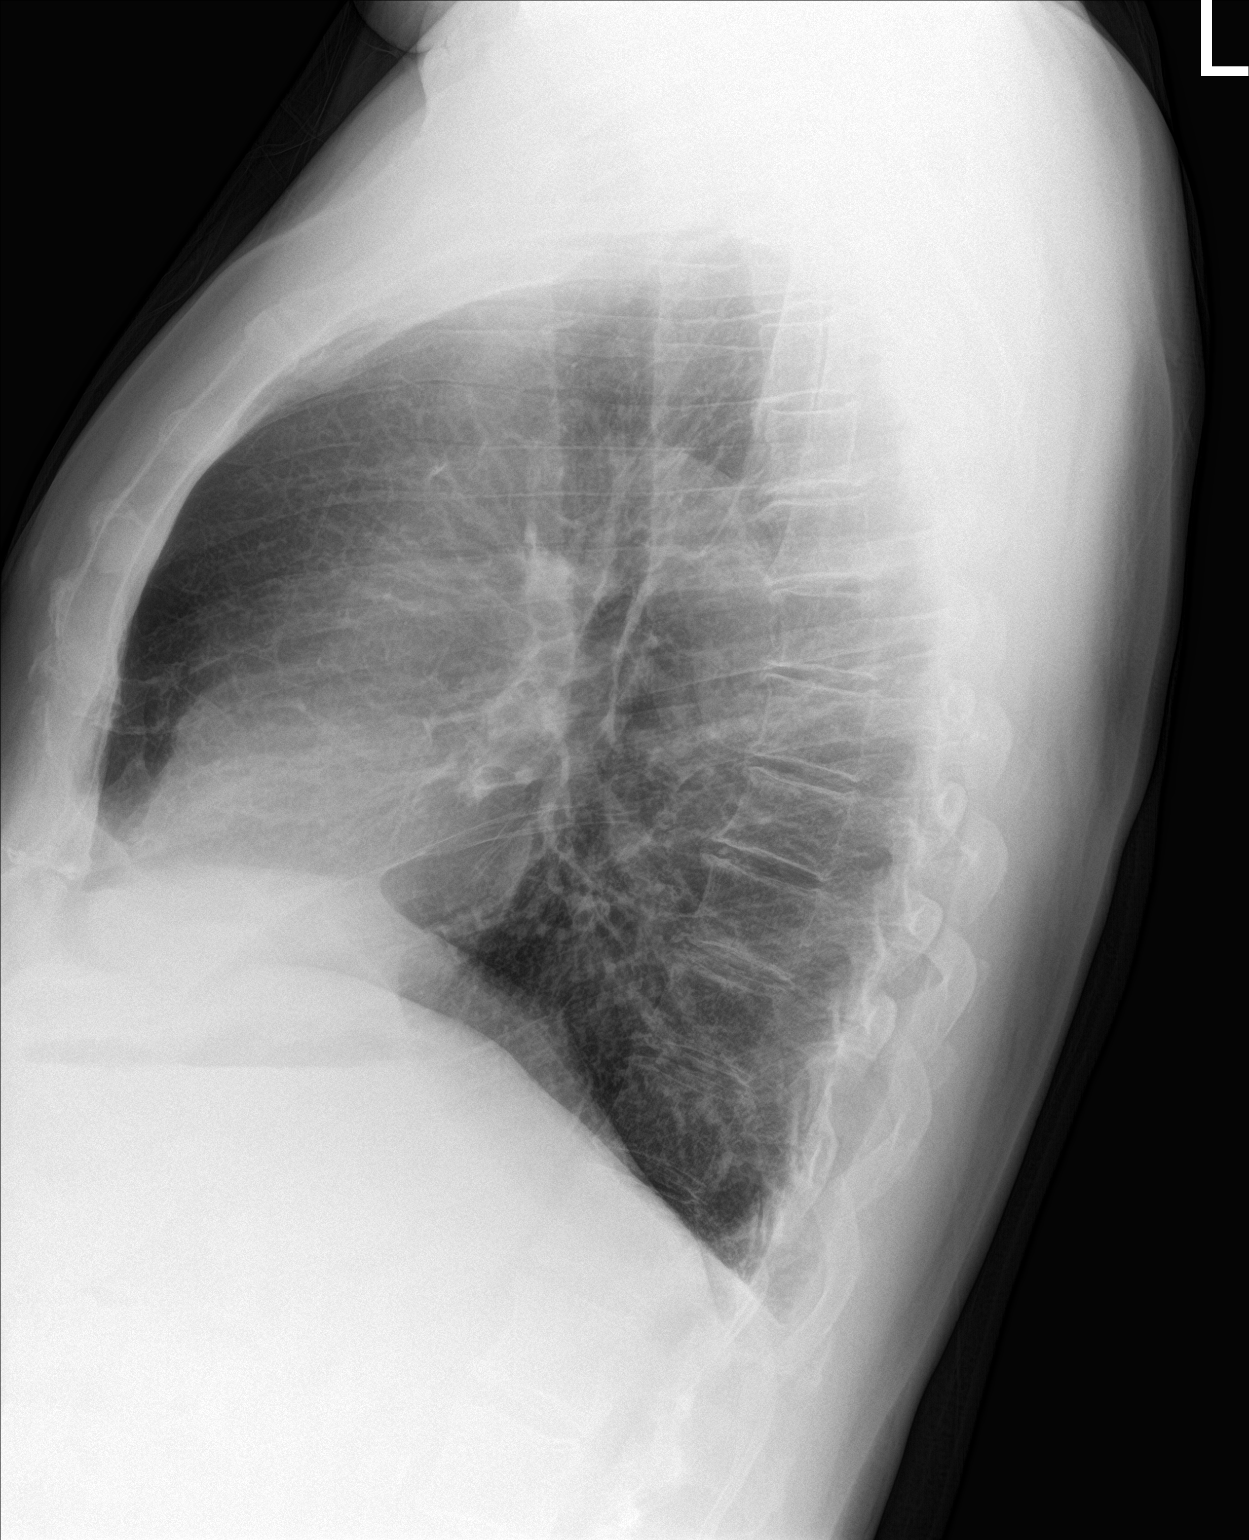

[2 of 2 positions shown; findings below may reference images not displayed]

FINDINGS: The cardiac silhouette, mediastinal and hilar contours are within
normal limits and stable. There is mild tortuosity and calcification
of the thoracic aorta. There are chronic scarring changes involving
both lung bases. No definite acute pulmonary findings. No pleural
effusion. The bony thorax is intact.
IMPRESSION: Chronic basilar scarring changes. No acute overlying pulmonary
process.

## 2022-11-11 ENCOUNTER — Emergency Department (HOSPITAL_BASED_OUTPATIENT_CLINIC_OR_DEPARTMENT_OTHER): Payer: Medicare Other

## 2022-11-11 ENCOUNTER — Emergency Department (HOSPITAL_BASED_OUTPATIENT_CLINIC_OR_DEPARTMENT_OTHER)
Admission: EM | Admit: 2022-11-11 | Discharge: 2022-11-12 | Disposition: A | Discharge status: Skilled Nursing Facility | Payer: Medicare Other | Attending: Emergency Medicine | Admitting: Emergency Medicine

## 2022-11-11 DIAGNOSIS — Z951 Presence of aortocoronary bypass graft: Secondary | ICD-10-CM | POA: Insufficient documentation

## 2022-11-11 DIAGNOSIS — I2510 Atherosclerotic heart disease of native coronary artery without angina pectoris: Secondary | ICD-10-CM | POA: Insufficient documentation

## 2022-11-11 DIAGNOSIS — R079 Chest pain, unspecified: Secondary | ICD-10-CM

## 2022-11-11 DIAGNOSIS — S301XXD Contusion of abdominal wall, subsequent encounter: Secondary | ICD-10-CM | POA: Diagnosis not present

## 2022-11-11 DIAGNOSIS — I10 Essential (primary) hypertension: Secondary | ICD-10-CM | POA: Diagnosis not present

## 2022-11-11 DIAGNOSIS — S0083XA Contusion of other part of head, initial encounter: Secondary | ICD-10-CM | POA: Diagnosis not present

## 2022-11-11 DIAGNOSIS — L905 Scar conditions and fibrosis of skin: Secondary | ICD-10-CM | POA: Diagnosis not present

## 2022-11-11 DIAGNOSIS — Y92129 Unspecified place in nursing home as the place of occurrence of the external cause: Secondary | ICD-10-CM | POA: Insufficient documentation

## 2022-11-11 DIAGNOSIS — F0390 Unspecified dementia without behavioral disturbance: Secondary | ICD-10-CM | POA: Diagnosis not present

## 2022-11-11 DIAGNOSIS — S7001XA Contusion of right hip, initial encounter: Secondary | ICD-10-CM | POA: Diagnosis not present

## 2022-11-11 DIAGNOSIS — S0012XA Contusion of left eyelid and periocular area, initial encounter: Secondary | ICD-10-CM | POA: Diagnosis not present

## 2022-11-11 DIAGNOSIS — S20211A Contusion of right front wall of thorax, initial encounter: Secondary | ICD-10-CM | POA: Diagnosis not present

## 2022-11-11 DIAGNOSIS — Z7982 Long term (current) use of aspirin: Secondary | ICD-10-CM | POA: Diagnosis not present

## 2022-11-11 DIAGNOSIS — W19XXXA Unspecified fall, initial encounter: Secondary | ICD-10-CM | POA: Diagnosis not present

## 2022-11-11 DIAGNOSIS — R0789 Other chest pain: Secondary | ICD-10-CM | POA: Diagnosis present

## 2022-11-11 LAB — COMPREHENSIVE METABOLIC PANEL
ALT: 16 U/L (ref 0–44)
AST: 27 U/L (ref 15–41)
Albumin: 3.4 g/dL — ABNORMAL LOW (ref 3.5–5.0)
Alkaline Phosphatase: 110 U/L (ref 38–126)
Anion gap: 7 (ref 5–15)
BUN: 16 mg/dL (ref 8–23)
CO2: 27 mmol/L (ref 22–32)
Calcium: 8.5 mg/dL — ABNORMAL LOW (ref 8.9–10.3)
Chloride: 103 mmol/L (ref 98–111)
Creatinine, Ser: 0.87 mg/dL (ref 0.61–1.24)
GFR, Estimated: >60 mL/min (ref >60)
Glucose, Bld: 102 mg/dL — ABNORMAL HIGH (ref 70–99)
Potassium: 3.8 mmol/L (ref 3.5–5.1)
Sodium: 137 mmol/L (ref 135–145)
Total Bilirubin: 0.8 mg/dL (ref 0.3–1.2)
Total Protein: 6.5 g/dL (ref 6.5–8.1)

## 2022-11-11 LAB — CBC WITH DIFFERENTIAL/PLATELET
Abs Immature Granulocytes: 0.01 10*3/uL (ref 0.00–0.07)
Basophils Absolute: 0 10*3/uL (ref 0.0–0.1)
Basophils Relative: 1 %
Eosinophils Absolute: 0.3 10*3/uL (ref 0.0–0.5)
Eosinophils Relative: 4 %
HCT: 34.3 % — ABNORMAL LOW (ref 39.0–52.0)
Hemoglobin: 11.5 g/dL — ABNORMAL LOW (ref 13.0–17.0)
Immature Granulocytes: 0 %
Lymphocytes Relative: 35 %
Lymphs Abs: 2.7 10*3/uL (ref 0.7–4.0)
MCH: 29.6 pg (ref 26.0–34.0)
MCHC: 33.5 g/dL (ref 30.0–36.0)
MCV: 88.2 fL (ref 80.0–100.0)
Monocytes Absolute: 1 10*3/uL (ref 0.1–1.0)
Monocytes Relative: 13 %
Neutro Abs: 3.6 10*3/uL (ref 1.7–7.7)
Neutrophils Relative %: 47 %
Platelets: 244 10*3/uL (ref 150–400)
RBC: 3.89 MIL/uL — ABNORMAL LOW (ref 4.22–5.81)
RDW: 13.1 % (ref 11.5–15.5)
WBC: 7.6 10*3/uL (ref 4.0–10.5)
nRBC: 0 % (ref 0.0–0.2)

## 2022-11-11 LAB — TROPONIN I (HIGH SENSITIVITY)
Troponin I (High Sensitivity): 5 ng/L (ref <18)
Troponin I (High Sensitivity): 6 ng/L (ref <18)

## 2022-11-11 NOTE — Discharge Instructions (Signed)
As we discussed, your CT scan and x-rays today did not show any acute fractures  You had an episode of chest pain so we also had lab work done and your heart enzyme test are normal  Fall precautions  See your doctor for follow-up  Return to ER if he has another fall, chest pain, shortness of breath

## 2022-11-11 NOTE — ED Triage Notes (Signed)
Per staff at [facility] care pt had unwitnessed fall 1 hr PTA, acting at baseline per staff, history of being combative Bruising under left eye, in a healing stage, VS stable Not on bloodthinners Not complaining of pain Sent per policy of Memory care unit

## 2022-11-11 NOTE — ED Notes (Signed)
Bruising noted to left eye, right hip, areas to abdomen and left forearm

## 2022-11-11 NOTE — ED Notes (Signed)
Patient transported to CT 

## 2022-11-11 NOTE — ED Provider Notes (Signed)
[facility] Provider Note   CSN: [id number] Arrival date & time: 11/11/22  1737     History  Chief Complaint  Patient presents with   Marletta Lor    ANTHON PLOTTS is a 82 y.o. male history of dementia, hypertension, CAD status post CABG here presenting with fall and possible chest pain.  Patient has frequent falls.  Patient apparently fell about a week ago and went to [facility] and had CT head that was unremarkable.  Patient apparently had another unwitnessed fall about an hour prior to arrival.  He cannot tell me what happened.  Patient initially was not complaining of any pain to the staff but told me that he had some chest pain.  However he is a very poor historian and is very demented at baseline.  The history is provided by the patient.       Home Medications Prior to Admission medications   Medication Sig Start Date End Date Taking? Authorizing Provider  amitriptyline (ELAVIL) 50 MG tablet Take 50 mg by mouth at bedtime.    [provider]  aspirin (BAYER LOW STRENGTH) 81 MG EC tablet Take 81 mg by mouth daily.      [provider]  atorvastatin (LIPITOR) 40 MG tablet Take 40 mg by mouth daily.    [provider]  beta carotene w/minerals (OCUVITE) tablet Take 1 tablet by mouth daily.    [provider]  clonazePAM (KLONOPIN) 1 MG tablet Take 1 mg by mouth at bedtime.    [provider]  esomeprazole (NEXIUM) 40 MG capsule TAKE 1 CAPSULE EVERY DAY 07/22/16   Setzer, Terri L, NP  fenofibrate 160 MG tablet Take 160 mg by mouth daily.    [provider]  fluticasone (FLONASE) 50 MCG/ACT nasal spray as needed. 12/05/15   [provider]  Magnesium 400 MG TABS Take 1,200 mg by mouth daily.    [provider]  meclizine (ANTIVERT) 25 MG tablet Take 25 mg by mouth as needed for dizziness.    [provider]  Multiple Vitamin (MULTIVITAMIN) tablet Take 1 tablet by  mouth daily.      [provider]  NON FORMULARY CPAP Use as directed    [provider]  PARoxetine (PAXIL) 40 MG tablet Take 40 mg by mouth every morning.    [provider]  travoprost, benzalkonium, (TRAVATAN) 0.004 % ophthalmic solution 1 drop at bedtime.    [provider]      Allergies    Dilaudid [hydromorphone hcl], Hydromorphone hcl, Morphine, Codeine, Erythromycin, Ketorolac, Ultram [tramadol], and Lyrica [pregabalin]    Review of Systems   Review of Systems  Cardiovascular:  Positive for chest pain.  All other systems reviewed and are negative.   Physical Exam Updated Vital Signs BP (!) 158/98 (BP Location: Right Arm)   Pulse 81   Temp 98.8 F (37.1 C) (Oral)   Resp 18   Ht 5\' 10"  (1.778 m)   Wt 85.7 kg   SpO2 99%   BMI 27.11 kg/m  Physical Exam Vitals and nursing note reviewed.  Constitutional:      Appearance: Normal appearance.     Comments: Chronically ill and demented  HENT:     Head: Normocephalic.     Comments: Patient has some ecchymosis around the left eye which appears to be from previous fall.  His extraocular movements are intact    Nose: Nose normal.  Mouth/Throat:     Mouth: Mucous membranes are moist.  Eyes:     Extraocular Movements: Extraocular movements intact.     Pupils: Pupils are equal, round, and reactive to light.  Cardiovascular:     Rate and Rhythm: Normal rate and regular rhythm.     Pulses: Normal pulses.     Heart sounds: Normal heart sounds.  Pulmonary:     Effort: Pulmonary effort is normal.     Breath sounds: Normal breath sounds.     Comments: Previous CABG scar.  Mild chest wall tenderness.  Patient has some bruising of the right lower ribs Abdominal:     General: Abdomen is flat.     Palpations: Abdomen is soft.     Comments: Old bruising in the left lower quadrant but no abdominal tenderness  Musculoskeletal:     Cervical back: Normal range of motion and neck supple.      Comments: Patient has some bruising in the right hip area.  Normal range of motion bilateral hips  Skin:    General: Skin is warm.     Capillary Refill: Capillary refill takes less than 2 seconds.  Neurological:     Mental Status: He is alert.     Comments: ANO x 1, moving all extremities  Psychiatric:        Mood and Affect: Mood normal.        Behavior: Behavior normal.     ED Results / Procedures / Treatments   Labs (all labs ordered are listed, but only abnormal results are displayed) Labs Reviewed  CBC WITH DIFFERENTIAL/PLATELET  COMPREHENSIVE METABOLIC PANEL  TROPONIN I (HIGH SENSITIVITY)    EKG None  Radiology No results found.  Procedures Procedures    Medications Ordered in ED Medications - No data to display  ED Course/ Medical Decision Making/ A&P                             Medical Decision Making MADDEX ALLTOP is a 82 y.o. male here presenting with fall.  Patient had a unwitnessed fall about an hour prior to arrival.  Patient complains of some chest pain.  He does have a history of CABG.  Patient is demented and is a poor historian.  Plan to get CBC and CMP and troponin x 2 and chest x-ray as well as CT head and cervical spine and CT maxillofacial.  8:31 PM Labs showed troponin negative x 2.  Reviewed patient's CT scan and did not show any acute fractures.   Patient is sleeping comfortably.  At this point patient is stable for discharge  Problems Addressed: Chest pain, unspecified type: acute illness or injury Contusion of face, initial encounter: acute illness or injury Fall, initial encounter: acute illness or injury  Amount and/or Complexity of Data Reviewed Labs: ordered. Decision-making details documented in ED Course. Radiology: ordered and independent interpretation performed. Decision-making details documented in ED Course. ECG/medicine tests: ordered and independent interpretation performed. Decision-making details documented in ED  Course.    Final Clinical Impression(s) / ED Diagnoses Final diagnoses:  None    Rx / DC Orders ED Discharge Orders     None         Charlynne Pander, MD 11/11/22 2032

## 2022-11-12 ENCOUNTER — Telehealth (HOSPITAL_BASED_OUTPATIENT_CLINIC_OR_DEPARTMENT_OTHER): Payer: Self-pay | Admitting: Emergency Medicine

## 2022-11-12 NOTE — ED Notes (Signed)
Called [facility] and spoke to Valrie Hart in regards to PIV that was still documented in the chart. [name] reports that PTAR removed it at the facility when they arrived. Cannula tip intact. Also let [name] know that pt's hearing aid x1 was left in the room when PTAR transferred pt to their stretcher. [name] also asked if a CBC and a UA were completed during his stay and that their doctor was asking. Let [name] know that a CBC was completed, but not a UA.

## 2022-11-12 NOTE — ED Notes (Signed)
PTAR at bedside for pt d/c transport back to [place].

## 2022-11-14 ENCOUNTER — Telehealth (HOSPITAL_BASED_OUTPATIENT_CLINIC_OR_DEPARTMENT_OTHER): Payer: Self-pay

## 2022-11-14 NOTE — Telephone Encounter (Signed)
Called and spoke again with staff at [facility] about patients hearing aide that was left in ED. Requested to again notify daughter, staff member states they had called daughter but she lives far away, however they will call again in attempts to have someone come pick it up. Informed her that it will be at nurses station.

## 2022-11-21 ENCOUNTER — Other Ambulatory Visit: Payer: Self-pay

## 2022-11-21 ENCOUNTER — Encounter (HOSPITAL_COMMUNITY): Payer: Self-pay

## 2022-11-21 ENCOUNTER — Emergency Department (HOSPITAL_COMMUNITY): Payer: Medicare Other

## 2022-11-21 ENCOUNTER — Emergency Department (HOSPITAL_COMMUNITY)
Admission: EM | Admit: 2022-11-21 | Discharge: 2022-11-21 | Disposition: A | Discharge status: Home / Self Care | Payer: Medicare Other | Attending: Emergency Medicine | Admitting: Emergency Medicine

## 2022-11-21 DIAGNOSIS — J449 Chronic obstructive pulmonary disease, unspecified: Secondary | ICD-10-CM | POA: Diagnosis not present

## 2022-11-21 DIAGNOSIS — G309 Alzheimer's disease, unspecified: Secondary | ICD-10-CM | POA: Diagnosis not present

## 2022-11-21 DIAGNOSIS — W19XXXA Unspecified fall, initial encounter: Secondary | ICD-10-CM | POA: Insufficient documentation

## 2022-11-21 DIAGNOSIS — S62617A Displaced fracture of proximal phalanx of left little finger, initial encounter for closed fracture: Secondary | ICD-10-CM | POA: Diagnosis not present

## 2022-11-21 DIAGNOSIS — Z7982 Long term (current) use of aspirin: Secondary | ICD-10-CM | POA: Diagnosis not present

## 2022-11-21 DIAGNOSIS — S0083XA Contusion of other part of head, initial encounter: Secondary | ICD-10-CM | POA: Insufficient documentation

## 2022-11-21 DIAGNOSIS — S60042A Contusion of left ring finger without damage to nail, initial encounter: Secondary | ICD-10-CM | POA: Insufficient documentation

## 2022-11-21 DIAGNOSIS — S6992XA Unspecified injury of left wrist, hand and finger(s), initial encounter: Secondary | ICD-10-CM | POA: Diagnosis present

## 2022-11-21 DIAGNOSIS — Y92129 Unspecified place in nursing home as the place of occurrence of the external cause: Secondary | ICD-10-CM | POA: Diagnosis not present

## 2022-11-21 DIAGNOSIS — S62615A Displaced fracture of proximal phalanx of left ring finger, initial encounter for closed fracture: Secondary | ICD-10-CM | POA: Insufficient documentation

## 2022-11-21 NOTE — ED Notes (Signed)
PTAR CALLED PT 2ND ONE OUT.

## 2022-11-21 NOTE — ED Notes (Signed)
Ortho tech called for splint placement. 

## 2022-11-21 NOTE — Progress Notes (Signed)
Orthopedic Tech Progress Note Patient Details:  Tyler Perkins 10/14/1940 [id number]  Ortho Devices Type of Ortho Device: Ulna gutter splint Ortho Device/Splint Location: lue Ortho Device/Splint Interventions: Ordered, Application, Adjustment  After speaking with the dr I applied the splint with a slight bend. Post Interventions Patient Tolerated: Well Instructions Provided: Care of device, Adjustment of device  Trinna Post 11/21/2022, 8:07 PM

## 2022-11-21 NOTE — ED Provider Notes (Signed)
[place] EMERGENCY DEPARTMENT AT [facility] Provider Note   CSN: [id number] Arrival date & time: 11/21/22  1509     History  Chief Complaint  Patient presents with   Fall   Altered Mental Status    Tyler Perkins is a 82 y.o. male.  HPI     This is an 82 year old male with a history of Alzheimer's who presents by EMS with concern for an unwitnessed fall.  He presents from [facility].  Patient does not provide much history but he does tell me that he rolled out of bed earlier today hitting his left side.  He did not hit his head but he is on blood thinners.  He is only oriented to himself and appears to be at his baseline.  He has noted to have pain and swelling in the left fourth digit.  He has Steri-Strips over his nose.    Level 5 caveat   Home Medications Prior to Admission medications   Medication Sig Start Date End Date Taking? Authorizing Provider  amitriptyline (ELAVIL) 50 MG tablet Take 50 mg by mouth at bedtime.    [provider]  aspirin (BAYER LOW STRENGTH) 81 MG EC tablet Take 81 mg by mouth daily.      [provider]  atorvastatin (LIPITOR) 40 MG tablet Take 40 mg by mouth daily.    [provider]  beta carotene w/minerals (OCUVITE) tablet Take 1 tablet by mouth daily.    [provider]  clonazePAM (KLONOPIN) 1 MG tablet Take 1 mg by mouth at bedtime.    [provider]  esomeprazole (NEXIUM) 40 MG capsule TAKE 1 CAPSULE EVERY DAY 07/22/16   Setzer, Terri L, NP  fenofibrate 160 MG tablet Take 160 mg by mouth daily.    [provider]  fluticasone (FLONASE) 50 MCG/ACT nasal spray as needed. 12/05/15   [provider]  Magnesium 400 MG TABS Take 1,200 mg by mouth daily.    [provider]  meclizine (ANTIVERT) 25 MG tablet Take 25 mg by mouth as needed for dizziness.    [provider]  Multiple Vitamin (MULTIVITAMIN) tablet Take 1 tablet by mouth daily.      [provider]  NON FORMULARY CPAP Use as directed    [provider]  PARoxetine (PAXIL) 40 MG tablet Take 40 mg by mouth every morning.    [provider]  travoprost, benzalkonium, (TRAVATAN) 0.004 % ophthalmic solution 1 drop at bedtime.    [provider]      Allergies    Dilaudid [hydromorphone hcl], Hydromorphone hcl, Morphine, Codeine, Erythromycin, Ketorolac, Ultram [tramadol], and Lyrica [pregabalin]    Review of Systems   Review of Systems  Unable to perform ROS: Dementia    Physical Exam Updated Vital Signs BP (!) 152/70   Pulse 68   Temp 98 F (36.7 C) (Oral)   Resp 19   Ht 1.778 m (5\' 10" )   Wt 83.9 kg   SpO2 98%   BMI 26.54 kg/m  Physical Exam Vitals and nursing note reviewed.  Constitutional:      Appearance: He is well-developed. He is not ill-appearing.  HENT:     Head:     Comments: Healing ecchymosis under the left eye, Steri-Strips over the nose, abrasion right forehead    Mouth/Throat:     Mouth: Mucous membranes are moist.  Eyes:     Pupils: Pupils are equal, round, and reactive to light.  Cardiovascular:     Rate and Rhythm: Normal rate and regular rhythm.     Heart sounds: Normal heart sounds. No murmur heard. Pulmonary:     Effort: Pulmonary effort is normal. No respiratory distress.     Breath sounds: Normal breath sounds. No wheezing.  Abdominal:     Palpations: Abdomen is soft.     Tenderness: There is no abdominal tenderness. There is no rebound.  Musculoskeletal:     Cervical back: Neck supple.     Comments: Normal range of motion bilateral hips and knees, no obvious deformities, there is tenderness and swelling to the left fourth digit with ecchymosis noted  Lymphadenopathy:     Cervical: No cervical adenopathy.  Skin:    General: Skin is warm and dry.  Neurological:     Mental Status: He is alert.     Comments: Oriented to self  Psychiatric:        Mood and Affect: Mood normal.     ED Results /  Procedures / Treatments   Labs (all labs ordered are listed, but only abnormal results are displayed) Labs Reviewed - No data to display  EKG EKG Interpretation  Date/Time:  Thursday November 21 2022 16:40:31 EDT Ventricular Rate:  73 PR Interval:  213 QRS Duration: 105 QT Interval:  442 QTC Calculation: 488 R Axis:   76 Text Interpretation: Sinus or ectopic atrial rhythm Borderline prolonged PR interval Abnormal R-wave progression, early transition Borderline prolonged QT interval Confirmed by Ross Marcus ([id number]) on 11/21/2022 5:47:00 PM  Radiology CT Head Wo Contrast  Result Date: 11/21/2022 CLINICAL DATA:  Head trauma, minor (Age >= 65y); Neck trauma (Age >= 65y). Fall. EXAM: CT HEAD WITHOUT CONTRAST CT CERVICAL SPINE WITHOUT CONTRAST TECHNIQUE: Multidetector CT imaging of the head and cervical spine was performed following the standard protocol without intravenous contrast. Multiplanar CT image reconstructions of the cervical spine were also generated. RADIATION DOSE REDUCTION: This exam was performed according to the departmental dose-optimization program which includes automated exposure control, adjustment of the mA and/or kV according to patient size and/or use of iterative reconstruction technique. COMPARISON:  CT head and cervical spine 11/19/2022 FINDINGS: CT HEAD FINDINGS Brain: There is no evidence of an acute infarct, intracranial hemorrhage, mass, midline shift, or extra-axial fluid collection. Moderate cerebral atrophy is noted. Patchy hypodensities in the cerebral white matter are unchanged and nonspecific but compatible with moderate chronic small vessel ischemic disease. Vascular: Calcified atherosclerosis at the skull base. No hyperdense vessel. Skull: No acute fracture or suspicious osseous lesion. Sinuses/Orbits: Visualized paranasal sinuses and mastoid air cells are clear. Bilateral cataract extraction. Right scleral buckle. Other: None. CT CERVICAL SPINE FINDINGS  Alignment: Cervical spine straightening. Unchanged trace anterolisthesis of C7 on T1. Skull base and vertebrae: No acute fracture or suspicious osseous lesion. Soft tissues and spinal canal: No prevertebral fluid or swelling. No visible canal hematoma. Disc levels: Unchanged multilevel disc degeneration, most notable at C5-6 and C6-7 where broad-based posterior disc osteophyte complexes result in likely moderate spinal stenosis and advanced bilateral neural foraminal stenosis. Upper chest: Mild pleuroparenchymal scarring and centrilobular emphysema in the lung apices. Other: None. IMPRESSION: 1. No evidence of acute intracranial abnormality. 2. Moderate chronic small vessel ischemic disease and cerebral atrophy. 3. No acute cervical spine fracture. 4.  Emphysema (ICD10-J43.9). Electronically Signed   By: Sebastian Ache M.D.   On: 11/21/2022 16:33   CT Cervical Spine Wo Contrast  Result Date: 11/21/2022 CLINICAL DATA:  Head trauma, minor (Age >= 65y);  Neck trauma (Age >= 65y). Fall. EXAM: CT HEAD WITHOUT CONTRAST CT CERVICAL SPINE WITHOUT CONTRAST TECHNIQUE: Multidetector CT imaging of the head and cervical spine was performed following the standard protocol without intravenous contrast. Multiplanar CT image reconstructions of the cervical spine were also generated. RADIATION DOSE REDUCTION: This exam was performed according to the departmental dose-optimization program which includes automated exposure control, adjustment of the mA and/or kV according to patient size and/or use of iterative reconstruction technique. COMPARISON:  CT head and cervical spine 11/19/2022 FINDINGS: CT HEAD FINDINGS Brain: There is no evidence of an acute infarct, intracranial hemorrhage, mass, midline shift, or extra-axial fluid collection. Moderate cerebral atrophy is noted. Patchy hypodensities in the cerebral white matter are unchanged and nonspecific but compatible with moderate chronic small vessel ischemic disease. Vascular:  Calcified atherosclerosis at the skull base. No hyperdense vessel. Skull: No acute fracture or suspicious osseous lesion. Sinuses/Orbits: Visualized paranasal sinuses and mastoid air cells are clear. Bilateral cataract extraction. Right scleral buckle. Other: None. CT CERVICAL SPINE FINDINGS Alignment: Cervical spine straightening. Unchanged trace anterolisthesis of C7 on T1. Skull base and vertebrae: No acute fracture or suspicious osseous lesion. Soft tissues and spinal canal: No prevertebral fluid or swelling. No visible canal hematoma. Disc levels: Unchanged multilevel disc degeneration, most notable at C5-6 and C6-7 where broad-based posterior disc osteophyte complexes result in likely moderate spinal stenosis and advanced bilateral neural foraminal stenosis. Upper chest: Mild pleuroparenchymal scarring and centrilobular emphysema in the lung apices. Other: None. IMPRESSION: 1. No evidence of acute intracranial abnormality. 2. Moderate chronic small vessel ischemic disease and cerebral atrophy. 3. No acute cervical spine fracture. 4.  Emphysema (ICD10-J43.9). Electronically Signed   By: Sebastian Ache M.D.   On: 11/21/2022 16:33   DG Hand Complete Left  Result Date: 11/21/2022 CLINICAL DATA:  Trauma EXAM: LEFT HAND - COMPLETE 3 VIEW COMPARISON:  None Available. FINDINGS: Minimally displaced fracture proximal fourth proximal phalanx and distal fifth proximal phalanx. Interphalangeal degenerative changes noted. No osteolytic or osteoblastic changes. IMPRESSION: Fourth and fifth metacarpal fractures. Electronically Signed   By: Layla Maw M.D.   On: 11/21/2022 16:18    Procedures Procedures    Medications Ordered in ED Medications - No data to display  ED Course/ Medical Decision Making/ A&P                             Medical Decision Making Amount and/or Complexity of Data Reviewed Radiology: ordered.   This patient presents to the ED for concern of fall, this involves an extensive  number of treatment options, and is a complaint that carries with it a high risk of complications and morbidity.  I considered the following differential and admission for this acute, potentially life threatening condition.  The differential diagnosis includes contusion, fracture, head injury  MDM:    This is an 82 year old male who presents following an unwitnessed fall.  He is oriented x 2.  He can tell me that he rolled off the bed.  At baseline he has some dementia.  He has multiple areas of ecchymosis and what appears to be old healing bruising.  He does have what appears to be acute deformity of the fourth digit with some swelling.  Otherwise I am able to range all extremities without significant pain.  CT head neck obtained.  No evidence of acute traumatic bleed or fracture.  X-rays of the left hand show left third and fourth phalanx fractures.  Splint was placed.  Patient is otherwise at his baseline.  Will return to nursing home.  Otherwise follow-up with hand surgery provided.  (Labs, imaging, consults)  Labs: I Ordered, and personally interpreted labs.  The pertinent results include: None  Imaging Studies ordered: I ordered imaging studies including left hand x-ray, CT head neck I independently visualized and interpreted imaging. I agree with the radiologist interpretation  Additional history obtained from chart review.  External records from outside source obtained and reviewed including prior evaluations  Cardiac Monitoring: The patient was maintained on a cardiac monitor.  If on the cardiac monitor, I personally viewed and interpreted the cardiac monitored which showed an underlying rhythm of: Sinus rhythm  Reevaluation: After the interventions noted above, I reevaluated the patient and found that they have :stayed the same  Social Determinants of Health:  lives in a nursing home  Disposition: Discharge  Co morbidities that complicate the patient evaluation  Past Medical  History:  Diagnosis Date   COPD (chronic obstructive pulmonary disease)    GERD (gastroesophageal reflux disease)    Lung nodule    Macular degeneration    Bilateral   Mixed hyperlipidemia    Obstructive sleep apnea      Medicines No orders of the defined types were placed in this encounter.   I have reviewed the patients home medicines and have made adjustments as needed  Problem List / ED Course: Problem List Items Addressed This Visit   None Visit Diagnoses     Closed displaced fracture of proximal phalanx of left little finger, initial encounter    -  Primary   Closed displaced fracture of proximal phalanx of left ring finger, initial encounter                       Final Clinical Impression(s) / ED Diagnoses Final diagnoses:  Closed displaced fracture of proximal phalanx of left little finger, initial encounter  Closed displaced fracture of proximal phalanx of left ring finger, initial encounter    Rx / DC Orders ED Discharge Orders     None         Sylvester Minton, Mayer Masker, MD 11/21/22 2000

## 2022-11-21 NOTE — Discharge Instructions (Signed)
You were seen today after an unwitnessed fall.  You have fractures to the fourth and fifth digits of the left hand.  Otherwise her imaging was unremarkable.  Follow-up with hand surgery.

## 2022-11-21 NOTE — ED Triage Notes (Signed)
Pt to ED via EMS from [facility]. Per EMS staff stated pt had an unwitnessed fall today. Pt is on thinners but no head injury. Per EMS staff stated pt usually gets around with walker. Pt arrives to ED Alert to self at baseline pt has alzheimer's. Skin tears noted to pt nose, and arms with controlled bleeding. Pt has deformity to left 4th digit which was mobilized by EMS.

## 2023-02-24 ENCOUNTER — Ambulatory Visit: Payer: Medicare Other | Admitting: Neurology

## 2023-04-06 DEATH — deceased
